# Patient Record
Sex: Female | Born: 1960
Health system: Southern US, Community
[De-identification: ages and names within clinical notes are randomized; demographics above are authoritative.]

## PROBLEM LIST (undated history)

## (undated) DIAGNOSIS — G4700 Insomnia, unspecified: Secondary | ICD-10-CM

## (undated) DIAGNOSIS — Z8709 Personal history of other diseases of the respiratory system: Secondary | ICD-10-CM

## (undated) DIAGNOSIS — R112 Nausea with vomiting, unspecified: Secondary | ICD-10-CM

## (undated) DIAGNOSIS — F329 Major depressive disorder, single episode, unspecified: Secondary | ICD-10-CM

## (undated) DIAGNOSIS — R519 Headache, unspecified: Secondary | ICD-10-CM

## (undated) DIAGNOSIS — M1990 Unspecified osteoarthritis, unspecified site: Secondary | ICD-10-CM

## (undated) DIAGNOSIS — R42 Dizziness and giddiness: Secondary | ICD-10-CM

## (undated) DIAGNOSIS — R351 Nocturia: Secondary | ICD-10-CM

## (undated) DIAGNOSIS — Z9889 Other specified postprocedural states: Secondary | ICD-10-CM

## (undated) DIAGNOSIS — G8929 Other chronic pain: Secondary | ICD-10-CM

## (undated) DIAGNOSIS — I10 Essential (primary) hypertension: Secondary | ICD-10-CM

## (undated) DIAGNOSIS — R531 Weakness: Secondary | ICD-10-CM

## (undated) DIAGNOSIS — R51 Headache: Secondary | ICD-10-CM

## (undated) DIAGNOSIS — Z860100 Personal history of colon polyps, unspecified: Secondary | ICD-10-CM

## (undated) DIAGNOSIS — G4733 Obstructive sleep apnea (adult) (pediatric): Secondary | ICD-10-CM

## (undated) DIAGNOSIS — I359 Nonrheumatic aortic valve disorder, unspecified: Secondary | ICD-10-CM

## (undated) DIAGNOSIS — F32A Depression, unspecified: Secondary | ICD-10-CM

## (undated) DIAGNOSIS — M549 Dorsalgia, unspecified: Secondary | ICD-10-CM

## (undated) DIAGNOSIS — E785 Hyperlipidemia, unspecified: Secondary | ICD-10-CM

## (undated) DIAGNOSIS — E119 Type 2 diabetes mellitus without complications: Secondary | ICD-10-CM

## (undated) DIAGNOSIS — R3915 Urgency of urination: Secondary | ICD-10-CM

## (undated) DIAGNOSIS — R011 Cardiac murmur, unspecified: Secondary | ICD-10-CM

## (undated) DIAGNOSIS — R350 Frequency of micturition: Secondary | ICD-10-CM

## (undated) DIAGNOSIS — Z9989 Dependence on other enabling machines and devices: Secondary | ICD-10-CM

## (undated) HISTORY — PX: CHOLECYSTECTOMY: SHX55

## (undated) HISTORY — PX: COLONOSCOPY: SHX174

## (undated) HISTORY — DX: Obstructive sleep apnea (adult) (pediatric): G47.33

## (undated) HISTORY — PX: OTHER SURGICAL HISTORY: SHX169

## (undated) HISTORY — DX: Depression, unspecified: F32.A

## (undated) HISTORY — DX: Hyperlipidemia, unspecified: E78.5

## (undated) HISTORY — DX: Dependence on other enabling machines and devices: Z99.89

## (undated) HISTORY — PX: BREAST CYST ASPIRATION: SHX578

## (undated) HISTORY — DX: Major depressive disorder, single episode, unspecified: F32.9

## (undated) HISTORY — DX: Type 2 diabetes mellitus without complications: E11.9

## (undated) HISTORY — DX: Nonrheumatic aortic valve disorder, unspecified: I35.9

## (undated) HISTORY — PX: BACK SURGERY: SHX140

## (undated) HISTORY — PX: CERVICAL FUSION: SHX112

---

## 1996-07-13 HISTORY — PX: ABDOMINAL HYSTERECTOMY: SHX81

## 2002-06-23 ENCOUNTER — Encounter: Payer: Self-pay | Admitting: Family Medicine

## 2002-06-23 ENCOUNTER — Encounter: Admission: RE | Admit: 2002-06-23 | Discharge: 2002-06-23 | Payer: Self-pay | Admitting: Family Medicine

## 2002-07-07 ENCOUNTER — Encounter: Admission: RE | Admit: 2002-07-07 | Discharge: 2002-07-07 | Payer: Self-pay | Admitting: Family Medicine

## 2002-07-07 ENCOUNTER — Encounter: Payer: Self-pay | Admitting: Family Medicine

## 2002-07-21 ENCOUNTER — Ambulatory Visit (HOSPITAL_COMMUNITY): Admission: RE | Admit: 2002-07-21 | Discharge: 2002-07-21 | Payer: Self-pay | Admitting: General Surgery

## 2002-07-21 ENCOUNTER — Encounter (INDEPENDENT_AMBULATORY_CARE_PROVIDER_SITE_OTHER): Payer: Self-pay | Admitting: Specialist

## 2002-10-18 ENCOUNTER — Encounter (INDEPENDENT_AMBULATORY_CARE_PROVIDER_SITE_OTHER): Payer: Self-pay

## 2002-10-18 ENCOUNTER — Ambulatory Visit (HOSPITAL_COMMUNITY): Admission: RE | Admit: 2002-10-18 | Discharge: 2002-10-18 | Payer: Self-pay | Admitting: *Deleted

## 2005-02-07 ENCOUNTER — Emergency Department (HOSPITAL_COMMUNITY): Admission: EM | Admit: 2005-02-07 | Discharge: 2005-02-07 | Payer: Self-pay | Admitting: Emergency Medicine

## 2005-03-07 ENCOUNTER — Encounter (INDEPENDENT_AMBULATORY_CARE_PROVIDER_SITE_OTHER): Payer: Self-pay | Admitting: Specialist

## 2005-03-07 ENCOUNTER — Observation Stay (HOSPITAL_COMMUNITY): Admission: EM | Admit: 2005-03-07 | Discharge: 2005-03-08 | Payer: Self-pay | Admitting: Emergency Medicine

## 2005-07-10 ENCOUNTER — Encounter: Admission: RE | Admit: 2005-07-10 | Discharge: 2005-07-10 | Payer: Self-pay | Admitting: Family Medicine

## 2006-07-19 ENCOUNTER — Encounter: Admission: RE | Admit: 2006-07-19 | Discharge: 2006-07-19 | Payer: Self-pay | Admitting: Family Medicine

## 2006-12-29 ENCOUNTER — Inpatient Hospital Stay (HOSPITAL_BASED_OUTPATIENT_CLINIC_OR_DEPARTMENT_OTHER): Admission: RE | Admit: 2006-12-29 | Discharge: 2006-12-29 | Payer: Self-pay | Admitting: Cardiology

## 2007-07-26 ENCOUNTER — Other Ambulatory Visit: Admission: RE | Admit: 2007-07-26 | Discharge: 2007-07-26 | Payer: Self-pay | Admitting: Family Medicine

## 2007-08-04 ENCOUNTER — Encounter: Admission: RE | Admit: 2007-08-04 | Discharge: 2007-08-04 | Payer: Self-pay | Admitting: Family Medicine

## 2007-12-28 ENCOUNTER — Ambulatory Visit (HOSPITAL_BASED_OUTPATIENT_CLINIC_OR_DEPARTMENT_OTHER): Admission: RE | Admit: 2007-12-28 | Discharge: 2007-12-29 | Payer: Self-pay | Admitting: Urology

## 2008-08-31 ENCOUNTER — Encounter: Admission: RE | Admit: 2008-08-31 | Discharge: 2008-08-31 | Payer: Self-pay | Admitting: Family Medicine

## 2008-11-21 ENCOUNTER — Encounter: Admission: RE | Admit: 2008-11-21 | Discharge: 2008-11-21 | Payer: Self-pay | Admitting: Family Medicine

## 2009-11-15 ENCOUNTER — Encounter: Admission: RE | Admit: 2009-11-15 | Discharge: 2009-11-15 | Payer: Self-pay | Admitting: Family Medicine

## 2010-08-03 ENCOUNTER — Encounter: Payer: Self-pay | Admitting: Family Medicine

## 2010-10-31 ENCOUNTER — Other Ambulatory Visit: Payer: Self-pay | Admitting: Family Medicine

## 2010-10-31 DIAGNOSIS — Z1231 Encounter for screening mammogram for malignant neoplasm of breast: Secondary | ICD-10-CM

## 2010-11-21 ENCOUNTER — Ambulatory Visit
Admission: RE | Admit: 2010-11-21 | Discharge: 2010-11-21 | Disposition: A | Discharge status: Home / Self Care | Payer: BC Managed Care – PPO | Source: Ambulatory Visit | Attending: Family Medicine | Admitting: Family Medicine

## 2010-11-21 DIAGNOSIS — Z1231 Encounter for screening mammogram for malignant neoplasm of breast: Secondary | ICD-10-CM

## 2010-11-25 NOTE — Op Note (Signed)
[name], [name]               ACCOUNT NO.:  [id number]   MEDICAL RECORD NO.:  [id number]          PATIENT TYPE:  AMB   LOCATION:  [facility]                         FACILITY:  [facility]   PHYSICIAN:  Mark C. Vernie Ammons, M.D.  DATE OF BIRTH:  Dec 16, 1960   DATE OF PROCEDURE:  12/28/2007  DATE OF DISCHARGE:                               OPERATIVE REPORT   PREOPERATIVE DIAGNOSIS:  Cystocele.   POSTOPERATIVE DIAGNOSIS:  Cystocele.   PROCEDURE:  Anterior repair.   SURGEON:  Mark C. Vernie Ammons, M.D.   ASSISTANT:  Dr. Vonita Moss.   ANESTHESIA:  General.   DRAINS:  A 16-French Foley catheter.   BLOOD LOSS:  Approximately 25 mL.   SPECIMENS:  None.   COMPLICATIONS:  None.   INDICATIONS:  The patient is a 50 year old female with stress urinary  incontinence.  She was found on physical examination to have a grade 1-2  cystocele, but no rectocele.  She has elect to proceed with surgical  correction of her stress incontinence and also repair of her cystocele  simultaneously.  The risks, complications and alternatives have been  discussed with her.  She understands and elected to proceed.   DESCRIPTION OF OPERATION:  After informed consent, the patient was  brought to the major OR, placed on the table, administered general  anesthesia and then moved into the dorsal lithotomy position.  Her  genitalia was sterilely prepped and draped and an official time-out was  then performed.   A 16-French Foley catheter was placed in the bladder and a weighted  speculum was placed in the vagina.  Marcaine with epinephrine was then  used to infiltrate the subvaginal mucosa in the midline.  After allowing  adequate time for epinephrine effect, a midline incision was then made  from just inside the introitus back to the posterior aspect of the  vagina overlying the cystocele.  I used a combination of blunt and sharp  dissection to then dissect the bladder from the overlying vaginal mucosa  on each side,  exposing the cystocele and the lateral pubocervical  ligaments on each side.   A 0 braided polyester suture was then used in a figure eight fashion and  placed interrupted in the pubocervical ligaments on each side  approximating these in the midline and obliterating the cystocele  defect.  After performing this, the excess vaginal mucosa was excised  and the vaginal mucosal edges were begun with closure with running 2-0  Vicryl suture.  I stopped the closure about halfway through the incision  to allow adequate room for Dr. Vonita Moss to  perform his transobturator sling procedure, and then he proceeded to  close the remaining vaginal mucosa.  The patient tolerated this portion  of the procedure well with no intraoperative complications.  She will be  observed overnight and discharged in the morning.      Mark C. Vernie Ammons, M.D.  Electronically Signed     MCO/MEDQ  D:  12/28/2007  T:  12/28/2007  Job:  [id number]

## 2010-11-25 NOTE — H&P (Signed)
[name]                ACCOUNT NO.:  [id number]   MEDICAL RECORD NO.:  [id number]           PATIENT TYPE:   LOCATION:                                 FACILITY:   PHYSICIAN:  [name], M.D.    DATE OF BIRTH:   DATE OF ADMISSION:  12/29/2006  DATE OF DISCHARGE:                              HISTORY & PHYSICAL   CHIEF COMPLAINT:  Chest pain.   HISTORY OF PRESENT ILLNESS:  [name] is a 50 year old obese white  female who presents for elective cardiac catheterization.  She was seen  for evaluation of chest discomfort and shortness of breath over the past  52-month.  Her discomfort is described as a pressure-like sensation.  It  seems to be worse with exertion, but she has also had symptoms at rest.  It can be persistent for long periods of time.  She was referred for  Cardiolite testing which demonstrated poor exercise tolerance.  She  subsequently required adenosine infusion.  Her scintigraphic images  showed an anterior reversible defect that may be breast attenuation, but  ischemia could not be ruled out.  Her ejection fraction was normal at  66%.  She is now referred for diagnostic catheterization.   PAST MEDICAL HISTORY.:  1. Mild aortic insufficiency.  She does have the question of bicuspid      aortic valve.  2. History of tubal ligation in 1990.  3. Hysterectomy with bladder repair in 1998.  4. Gallbladder surgery in 2006.  5. Childbirth x2.  6. Hypertension.   ALLERGIES:  None.   CURRENT MEDICATIONS:  1. Hydrochlorothiazide 25 mg a day.  2. Lisinopril 10 mg a day.  3. Omega-3 daily.  4. Baby aspirin daily.   FAMILY HISTORY:  Father died at 16 but had bypass surgery.  Mother is  living up into her 62s.   SOCIAL HISTORY:  She works as a Merchandiser, retail at [employer], [employer].  She has  not smoked or used alcohol for the last 10-12 years.  She lives at home  with her husband and two daughters.   REVIEW OF SYSTEMS:  Is basically as noted above.  She  does have some  degree of chronic fatigue.  She has had a diagnosis of sleep apnea made  in the past.  She does have some complaints of arthritis in knees and  ankles.   PHYSICAL EXAMINATION:  GENERAL:  She is a pleasant young white female  who is overweight.  VITAL SIGNS:  Her blood pressure is 110/70 sitting and standing. Weight  is 192.  SKIN:  Warm and dry.  Color is unremarkable.  LUNGS:  Clear.  HEART:  Shows a regular rhythm.  She does have a very faint systolic  murmur, and S2 is somewhat increased,  ABDOMEN:  Soft.  EXTREMITIES:  Without edema.  NEUROLOGIC:  Shows no gross focal deficits.   Pertinent labs are pending.   OVERALL IMPRESSION:  1. Atypical chest pain.  2. Abnormal Cardiolite study.  3. Probable bicuspid aortic valve.  4. Hypertension.  5. Obesity.   PLAN:  Will proceed  on with diagnostic catheterization.  The procedure,  risks, and benefits have all been explained, and she is willing to  proceed on Wednesday, December 29, 2006.      [name], N.P.      [name], M.D.  Electronically Signed    LC/MEDQ  D:  12/27/2006  T:  12/27/2006  Job:  [id number]   cc:   [name] L. [name], M.D.

## 2010-11-25 NOTE — Op Note (Signed)
[name], [name]               ACCOUNT NO.:  [id number]   MEDICAL RECORD NO.:  [id number]         PATIENT TYPE:  HAMB   LOCATION:                               FACILITY:  [facility]   PHYSICIAN:  Maretta Bees. Vonita Moss, M.D.DATE OF BIRTH:  Dec 10, 1960   DATE OF PROCEDURE:  12/28/2007  DATE OF DISCHARGE:                               OPERATIVE REPORT   PREOPERATIVE DIAGNOSIS:  Stress urinary incontinence and cystocele.   POSTOPERATIVE DIAGNOSIS:  Stress urinary incontinence and cystocele.   PROCEDURE:  Obturator sling insertion.   SURGEON:  Maretta Bees. Vonita Moss, M.D.   ANESTHESIA:  General.   INDICATIONS:  This 50 year old lady has had a progressive problem with  stress incontinence and some urge incontinence.  She needs to wear pads.  She has had a previous hysterectomy and bladder suspension in 1998.  Urodynamics was performed and showed a good flow rate and a somewhat  hypersensitive bladder.  She leaked with high pressure contraction and a  leak point voiding pressure was at 75 mL with 157 cm of water.  She did  have a cystocele.  She has a question of dyssynergia and Dr. McDiarmid  felt that she still would be a good candidate for anterior repair and  sling although she could have some increased risk of retention or  frequency afterwards.  She was advised about the risk of bladder injury,  lack of a correction of her problem, incontinence, hemorrhage or  infection.   PROCEDURE IN DETAIL:  The patient was brought to the operating room and  initially Dr. Vernie Ammons with my assistance proceeded with a vaginal  incision and anterior repair dictated separately by him.  After he  completed his anterior repair and initially began the vaginal closure I  proceeded to insert an obturator sling.  The endopelvic fascia was well  exposed digitally.  Puncture wounds were made at the level of the  clitoris in the obturator fossa bilaterally.  A stab wound was made and  hemostat used to create a  satisfactory opening for the sling.  Using the  curved sling needle I marched it on the back of the obturator bone on  each side entering through the stab wounds and exiting in the endopelvic  fascia mid urethra on each side.  The sling was attached and pulled up  into position but before it was put in a final position I cystoscoped  and there was no evidence of bladder or urethral injury.  Foley catheter  was reinserted.  The sling was snugged down to allow a hemostat between  the urethra and the sling.  Excess sling was trimmed at the puncture  wounds.  Wound was irrigated with antibiotic solution.  The sling was  felt to be with no undue tension and in the mid urethra.  The vaginal  incision was then completely closed with a running 2-0 Vicryl.  The  obturator puncture wounds were closed with Dermabond.  Estrace cream  impregnated vaginal pack was placed  vaginally and Foley catheter was left indwelling with clear urine.  She  was taken to the recovery  room in good condition with estimated blood  loss of 50 mL and having tolerated the procedure well.  Sponge, needle  and instrument count was correct.      Maretta Bees. Vonita Moss, M.D.  Electronically Signed     LJP/MEDQ  D:  12/28/2007  T:  12/28/2007  Job:  [id number]   cc:   Molly Maduro L. Foy Guadalajara, M.D.  Fax: [phone]

## 2010-11-25 NOTE — Cardiovascular Report (Signed)
[name], [name]               ACCOUNT NO.:  [id number]   MEDICAL RECORD NO.:  [id number]          PATIENT TYPE:  OIB   LOCATION:  1962                         FACILITY:  [facility]   PHYSICIAN:  Colleen Can. Deborah Chalk, M.D.DATE OF BIRTH:  15-May-1961   DATE OF PROCEDURE:  12/29/2006  DATE OF DISCHARGE:  12/29/2006                            CARDIAC CATHETERIZATION   PROCEDURE:  Left heart catheterization with selective coronary  angiography and left ventricular angiography.   TYPE AND SITE OF ENTRY:  Percutaneous right femoral artery.   CATHETERS:  4-French 4-curved Judkins left coronary catheter, 4-French  Williams right catheter, 4-French pigtail ventriculographic catheter.   CONTRAST:  Pure Omnipaque.   MEDICATIONS GIVEN PRIOR TO PROCEDURE:  Valium 10 mg.   MEDICATIONS GIVEN DURING PROCEDURE:  Versed 4 mg IV.   COMMENT:  The patient tolerated the procedure well.   HEMODYNAMIC DATA:  1. Aortic Pressure:  95/56,  2. Left Ventricular:  113/2-14.  On aortic valve pullback the LV pressure was 112/5-16, and aortic  pressure was 106/56 -- given what was felt to be a possible aortic valve  gradient of 6 mmHg.   ANGIOGRAPHIC DATA:  The left ventricular angiogram was performed in the  RAO position.  Overall cardiac size and silhouette are normal.  The  global ejection fraction estimated to be 60-70%.   CORONARY ARTERIES:  The coronary arteries arise and distribute normally.  1. The left main coronary artery is normal.  2. The left anterior descending has a moderate-sized high diagonal      vessel.  It is normal.  3. The left circumflex has a moderate sized bifurcating proximal      obtuse marginal, a large posterolateral branch.  It is normal.  4. The right coronary artery has a moderate sized dominant vessel.  It      is normal.   OVERALL IMPRESSION:  1. Normal left ventricular function.  2. Normal coronary arteries.  3. Minimal aortic valve gradient of less than 8 mmHg and  perhaps no      gradient existing, due to variations at the time of pullback.   DISCUSSION:  Overall, it is felt that [name] is stable from this  point in time.  She may have a bicuspid aortic valve, but it is felt not  to be hemodynamically significant.  She has normal coronary arteries and  normal left ventricular function.  We will make all efforts to modify  cardiovascular risk factors.  It is felt that the abnormal Cardiolite  study is related to breast attenuation.      Colleen Can. Deborah Chalk, M.D.  Electronically Signed     SNT/MEDQ  D:  12/29/2006  T:  12/29/2006  Job:  [id number]   cc:   Rae Lips. Foy Guadalajara, M.D.

## 2010-11-28 NOTE — Consult Note (Signed)
[name], [name] NO.:  [id number]   MEDICAL RECORD NO.:  [id number]          PATIENT TYPE:  EMS   LOCATION:  [facility]                         FACILITY:  [facility]   PHYSICIAN:  Sandria Bales. Ezzard Standing, M.D.  DATE OF BIRTH:  09/08/1960   DATE OF CONSULTATION:  DATE OF DISCHARGE:                                   CONSULTATION   CONSULTING PHYSICIAN:  Sandria Bales. Ezzard Standing, M.D.   HISTORY OF ILLNESS:  This is a 50 year old white female who is a patient of  Dr. Marinda Elk, who has had no prior GI symptoms, had sudden onset of  retrosternal pain about 1 a.m. this morning.  She went to the [facility].  They were concerned about cardiac disease and sent her to [facility] ER by ambulance.  [facility] ER evaluation was negative from a cardiac  standpoint.   It was  thought by the time she got here she was having more right upper  quadrant pain which radiated to her right back.  Her CK-MB was negative.  Her troponin was negative.  She went for an ultrasound of her abdomen that  showed a gallbladder with multiple stones, probable impacted stone.  She has  denied any history of peptic ulcer disease, liver disease, pancreatic  disease, or colon disease.  Her only prior surgery was a hysterectomy for  benign disease in 1996 by Dr. Katherine Roan.   PAST MEDICAL HISTORY:  1.  She has no allergies.  2.  Her only medication was hydrocodone for her back.   REVIEW OF SYSTEMS:  NEUROLOGIC:  She has apparently had an L5 disk with a  neuropathy down her right leg.  She has been seen by Dr. Tia Alert and  underwent a steroid injection about two weeks ago for this and is still out  because of her back pain.  PULMONARY:  No history of pneumonia, tuberculosis.  She does not smoke  cigarettes.  CARDIAC:  She has a mitral valve prolapse, has a heart murmur, has seen Dr.  Deborah Chalk about every five years.  GASTROINTESTINAL:  See history of present illness.  UROLOGIC:  She has had occasional  urinary tract infection or kidney  infection but never hospitalized for this.  GYN:  She has had a hysterectomy.   SOCIAL HISTORY:  She has two girls 16 and 20-years-of-age.  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  She is accompanied by her husband in the emergency room.   PHYSICAL EXAMINATION:  VITAL SIGNS:  Her temperature is 98.1, blood pressure  167/73, pulse 69, respirations 18, 02 sats are 100%.  GENERAL:  She is a well nourished, obese, white female, alert and  cooperative on physical exam.  HEENT:  Unremarkable.  NECK:  Supple.  I feel no masses, no thyromegaly.  LUNGS:  Clear to auscultation.  HEART:  Regular rate and rhythm.  I could not hear a murmur, though she says  she has one.  BREASTS:  Symmetric.  She has undergone mammograms which were reported as  normal.  ABDOMEN:  She is mildly sore in the epigastrium, maybe less so on the right  side of her belly.  She has bowel sounds which are present but may be  decreased.  She has no abdominal mass, no hernia, no CVA tenderness.  RECTAL:  I did not do a rectal exam on her.  EXTREMITIES:  She has good strength in the upper and lower extremities.  NEUROLOGIC:  Grossly intact.   LABS:  That I have, again, show a myoglobin is 23, a CK-MB is less than 1, a  troponin less than 0.05.  Her sodium is 133, potassium 4.2, chloride 101,  CO2 is 21.  Her creatinine is 0.7.  Her alk phos is 63, total bilirubin 0.6.  Her lipase is 12.  Her white blood count is 16,200, hemoglobin 13,  hematocrit 39.  I reviewed the ultrasound which showed multiple stones.  It is hard to tell  if one stone is impacted or not.  There is no thickening of the gallbladder  wall.   DIAGNOSES:  1.  Gallstones with probable biliary colic, possible cholecystitis.      Discussed with the patient about proceeding with gallbladder surgery      today or tomorrow.  I discussed with the patient and her husband the       indications and potential complications of gallbladder surgery.  I told      her there is a chance of the surgery being done open.  The potential      risks include, but are not limited to, infection and bleeding, bowel      injury, and common bile duct injury.  I think they understand this and      are willing to proceed with surgery.  2.  L5 disk disease, followed by Dr. Marikay Alar.  3.  Mitral valve prolapse, followed by Dr. Delfin Edis.  4.  Obesity.  5.  History of prior right breast cyst which was benign.      Sandria Bales. Ezzard Standing, M.D.  Electronically Signed     DHN/MEDQ  D:  03/07/2005  T:  03/07/2005  Job:  [id number]   cc:   Molly Maduro L. Foy Guadalajara, M.D.  [address] [place]  [place] [address]  Fax: [phone]   Jillyn Hidden. Deborah Chalk, M.D.  Fax: [phone]

## 2010-11-28 NOTE — Op Note (Signed)
   NAME:  [name], [name]                         ACCOUNT NO.:  [id number]   MEDICAL RECORD NO.:  [id number]                   PATIENT TYPE:  AMB   LOCATION:  DAY                                  [facility]ACILITY[facility]  [facility]   PHYSICIAN:  Ollen Gross. Vernell Morgans, M.D.              DATE OF BIRTH:  11/02/1960   DATE OF PROCEDURE:  07/21/2002  DATE OF DISCHARGE:                                 OPERATIVE REPORT   PREOPERATIVE DIAGNOSIS:  Intraductal papilloma beneath the right nipple.   POSTOPERATIVE DIAGNOSIS:  Intraductal papilloma beneath the right nipple.   PROCEDURE:  Subareolar right breast biopsy.   SURGEON:  Ollen Gross. Carolynne Edouard, M.D.   ANESTHESIA:  General via LMA.   DESCRIPTION OF PROCEDURE:  After informed consent was obtained, the patient  was brought to the operating room and placed in a supine position on the  operating room table.  After adequate induction of general anesthesia, the  patient's right breast was prepped with Betadine and draped in the usual  sterile manner.  A small transverse incision was made just beneath the  nipple on the right breast.  This incision was carried down through the skin  and subcutaneous tissue using sharp dissection with the electrocautery.  Once this was done, the tissue beneath the right nipple was excised sharply  with the Bovie electrocautery.  Hemostasis was achieved with the  electrocautery, and a small button hole type  of hole was made in the skin  just superior to the nipple.  The edges of this were cut out sharply with  the Metzenbaum scissors, and this hole was repaired with an interrupted 4-0  Monocryl subcuticular stitch.  The specimen was then removed from the  patient, sent to pathology for further evaluation.  The skin incision was  then closed with interrupted 4-0 Monocryl subcuticular stitches.  Benzoin,  Steri-Strips, and sterile dressings were applied.  The patient tolerated the  procedure well.  At the end of the case, all needle and sponge  counts were  correct.  The patient was awakened and taken to the recovery room in stable  condition.                                               Ollen Gross. Vernell Morgans, M.D.    PST/MEDQ  D:  07/21/2002  T:  07/21/2002  Job:  [id number]

## 2010-11-28 NOTE — Op Note (Signed)
[name], [name]               ACCOUNT NO.:  [id number]   MEDICAL RECORD NO.:  [id number]          PATIENT TYPE:  INP   LOCATION:  5732                         FACILITY:  [facility]   PHYSICIAN:  Sandria Bales. Ezzard Standing, M.D.  DATE OF BIRTH:  [birth year]   DATE OF PROCEDURE:  03/07/2005  DATE OF DISCHARGE:  03/08/2005                                 OPERATIVE REPORT   PREOPERATIVE DIAGNOSIS:  Cholecystitis with cholelithiasis.   POSTOPERATIVE DIAGNOSIS:  Acute and chronic cholecystitis with  cholelithiasis.   PROCEDURE:  Laparoscopic cholecystectomy with intraoperative cholangiogram.   SURGEON:  Sandria Bales. Ezzard Standing, M.D.   FIRST ASSISTANT:  Angelia Mould. Derrell Lolling, M.D.   ANESTHESIA:  General endotracheal with about 20 mL of 1% Xylocaine.   COMPLICATIONS:  None.   INDICATION FOR PROCEDURE:  [name] is a 50 year old white female, a  patient of Dr. Dewaine Oats, who has had a sudden onset of retrosternal and  right upper quadrant pain at approximately 1 a.m. this morning.  She  originally went to the [facility]; they were worried about cardiac  disease and sent her to the [facility] ER, who ruled out cardiac disease, but by  ultrasound, found she had multiple gallstones with what appeared to be an  impacted gallstone in the neck of her gallbladder.   She now comes for attempted laparoscopic cholecystectomy.  The indications  and potential complications of this surgery were explained to the patient;  potential complications include, but are not limited to, bleeding,  infection, bowel injury, bile duct injury and open surgery.   OPERATIVE NOTE:  The patient underwent a general endotracheal anesthetic.  She was given 1 g of Ancef at initiation of the procedure and had PAS  stockings in place.  Her abdomen was prepped with Betadine soaping solution  and sterilely draped.   An infraumbilical incision was made with sharp dissection and carried down  into the abdominal cavity.  I placed a 10-mm  0-degree laparoscope through a  12-mm Hasson trocar and secured this with a 0 Vicryl suture.  About midway  through the procedure, I did have to switch to a 30-degree angle scope just  because of how much fat she had in her abdomen and how much limited bowel  space.  I then placed 3 additional trocars, a 10-mm subxiphoid trocar and  then a 5-mm right subcostal and a 5-mm lateral subcostal.  To get the  lateral subcostal trocars placed, I did have to take the right colon which  was attached to the anterior abdominal wall, it was just adhesed up there,  actually, where I pulled these adhesions down.   I then identified the gallbladder, which was acutely distended and had  evidence of chronic disease.   I did decompress about 30 mL of a green muddy bilious material out of the  gallbladder and rotated the gallbladder up.   Dissection was carried out down to the cystic duct-gallbladder junction and  a clip was placed on the side of the gallbladder.   I then shot an intraoperative cholangiogram; the intraoperative  cholangiogram was shot  with a cut-off Taut catheter inserted through a 14-  gauge Jelco catheter and the Jelco catheter secured in the side of the cut  cystic duct with an endoclip.   The intraoperative cholangiogram was shot using half-strength Hypaque  solution, about 6 mL, which showed free flow of contrast down the cystic  duct into the common bile duct, into the duodenum and back up the hepatic  radicles.  Before putting the Taut catheter into the cystic duct, I did milk  back 2 stones, each about 4-5 mm in size, which were clearly impacted in the  cystic duct.  After the cholangiogram, which was felt to be normal, I then  placed 3 clips across the cystic duct, divided the cystic duct, identified  the cystic artery, put 2 clips on that and divided that.  She had at least 2  branches posteriorly going up her gallbladder wall that would also require  clips to control  bleeding.   I then dissected the gallbladder from the gallbladder bed using primary hook  Bovie coagulation.  Prior to complete division of the gallbladder from the  gallbladder bed, I reinspected the triangle of Calot; there was no bleeding  or bile leak.  I reinspected the gallbladder bed; there was no bleeding or  bile leak.   The gallbladder was then divided, placed into an EndoCatch bag and delivered  through the umbilicus.  I did spill maybe 5 or 6 small cholesterol stones,  each 3 or 4 mm, but these were retrieved with the scooper instrument.  I  then irrigated the abdomen and saw no residual stones, no bleeding, no bile  leak and this would be the end of the case.   The trocar placements were visualized; there was no bleeding at the trocar  sites.  The umbilical port was closed with a 0 Vicryl suture.  The skin at  each site was closed with 5-0 Vicryl suture, painted with tincture of  Benzoin and steri-stripped.  I used about 20 mL of local anesthetic in the  trocar sites.   The patient tolerated the procedure well.  Sponge and needle count were  correct at the end of the case.      Sandria Bales. Ezzard Standing, M.D.  Electronically Signed     DHN/MEDQ  D:  03/07/2005  T:  03/09/2005  Job:  [id number]   cc:   [name], M.D.  [address] [place]  [place] [address]  Fax: [phone]   [name], M.D.  [address] [place]  [place] [address]  Fax: [phone]   [name], M.D.  Fax: [phone]

## 2011-04-09 LAB — POCT I-STAT 4, (NA,K, GLUC, HGB,HCT)
Glucose, Bld: 111 — ABNORMAL HIGH
Sodium: 138

## 2011-09-01 ENCOUNTER — Other Ambulatory Visit: Payer: Self-pay | Admitting: Family Medicine

## 2011-09-01 DIAGNOSIS — M25562 Pain in left knee: Secondary | ICD-10-CM

## 2011-09-04 ENCOUNTER — Ambulatory Visit
Admission: RE | Admit: 2011-09-04 | Discharge: 2011-09-04 | Disposition: A | Discharge status: Home / Self Care | Payer: BC Managed Care – PPO | Source: Ambulatory Visit | Attending: Family Medicine | Admitting: Family Medicine

## 2011-09-04 DIAGNOSIS — M25562 Pain in left knee: Secondary | ICD-10-CM

## 2011-10-23 ENCOUNTER — Other Ambulatory Visit: Payer: Self-pay | Admitting: Family Medicine

## 2011-10-23 DIAGNOSIS — Z1231 Encounter for screening mammogram for malignant neoplasm of breast: Secondary | ICD-10-CM

## 2011-11-27 ENCOUNTER — Ambulatory Visit
Admission: RE | Admit: 2011-11-27 | Discharge: 2011-11-27 | Disposition: A | Discharge status: Home / Self Care | Payer: BC Managed Care – PPO | Source: Ambulatory Visit | Attending: Family Medicine | Admitting: Family Medicine

## 2011-11-27 DIAGNOSIS — Z1231 Encounter for screening mammogram for malignant neoplasm of breast: Secondary | ICD-10-CM

## 2012-12-16 ENCOUNTER — Other Ambulatory Visit: Payer: Self-pay | Admitting: Family Medicine

## 2012-12-16 DIAGNOSIS — Z1231 Encounter for screening mammogram for malignant neoplasm of breast: Secondary | ICD-10-CM

## 2013-01-20 ENCOUNTER — Ambulatory Visit: Payer: BC Managed Care – PPO

## 2013-01-27 ENCOUNTER — Ambulatory Visit: Payer: BC Managed Care – PPO

## 2013-04-14 ENCOUNTER — Ambulatory Visit
Admission: RE | Admit: 2013-04-14 | Discharge: 2013-04-14 | Disposition: A | Discharge status: Home / Self Care | Payer: BC Managed Care – PPO | Source: Ambulatory Visit | Attending: Family Medicine | Admitting: Family Medicine

## 2013-04-14 DIAGNOSIS — Z1231 Encounter for screening mammogram for malignant neoplasm of breast: Secondary | ICD-10-CM

## 2013-04-28 ENCOUNTER — Encounter (HOSPITAL_BASED_OUTPATIENT_CLINIC_OR_DEPARTMENT_OTHER): Payer: Self-pay | Admitting: Emergency Medicine

## 2013-04-28 ENCOUNTER — Emergency Department (HOSPITAL_BASED_OUTPATIENT_CLINIC_OR_DEPARTMENT_OTHER)
Admission: EM | Admit: 2013-04-28 | Discharge: 2013-04-28 | Disposition: A | Discharge status: Home / Self Care | Payer: BC Managed Care – PPO | Attending: Emergency Medicine | Admitting: Emergency Medicine

## 2013-04-28 ENCOUNTER — Emergency Department (HOSPITAL_BASED_OUTPATIENT_CLINIC_OR_DEPARTMENT_OTHER): Payer: BC Managed Care – PPO

## 2013-04-28 DIAGNOSIS — M5442 Lumbago with sciatica, left side: Secondary | ICD-10-CM

## 2013-04-28 DIAGNOSIS — M545 Low back pain, unspecified: Secondary | ICD-10-CM | POA: Insufficient documentation

## 2013-04-28 DIAGNOSIS — R209 Unspecified disturbances of skin sensation: Secondary | ICD-10-CM | POA: Insufficient documentation

## 2013-04-28 DIAGNOSIS — R52 Pain, unspecified: Secondary | ICD-10-CM | POA: Insufficient documentation

## 2013-04-28 DIAGNOSIS — Z87440 Personal history of urinary (tract) infections: Secondary | ICD-10-CM | POA: Insufficient documentation

## 2013-04-28 DIAGNOSIS — M25569 Pain in unspecified knee: Secondary | ICD-10-CM | POA: Insufficient documentation

## 2013-04-28 DIAGNOSIS — Z7982 Long term (current) use of aspirin: Secondary | ICD-10-CM | POA: Insufficient documentation

## 2013-04-28 DIAGNOSIS — Z79899 Other long term (current) drug therapy: Secondary | ICD-10-CM | POA: Insufficient documentation

## 2013-04-28 DIAGNOSIS — I10 Essential (primary) hypertension: Secondary | ICD-10-CM | POA: Insufficient documentation

## 2013-04-28 DIAGNOSIS — Z791 Long term (current) use of non-steroidal anti-inflammatories (NSAID): Secondary | ICD-10-CM | POA: Insufficient documentation

## 2013-04-28 DIAGNOSIS — R011 Cardiac murmur, unspecified: Secondary | ICD-10-CM | POA: Insufficient documentation

## 2013-04-28 DIAGNOSIS — M5430 Sciatica, unspecified side: Secondary | ICD-10-CM | POA: Insufficient documentation

## 2013-04-28 HISTORY — DX: Essential (primary) hypertension: I10

## 2013-04-28 HISTORY — DX: Cardiac murmur, unspecified: R01.1

## 2013-04-28 MED ORDER — DEXAMETHASONE SODIUM PHOSPHATE 10 MG/ML IJ SOLN
10.0000 mg | Freq: Once | INTRAMUSCULAR | Status: AC
  Administered 2013-04-28: 10 mg via INTRAMUSCULAR
  Filled 2013-04-28: qty 1

## 2013-04-28 NOTE — Discharge Instructions (Signed)
Radicular Pain Radicular pain in either the arm or leg is usually from a bulging or herniated disk in the spine. A piece of the herniated disk may press against the nerves as the nerves exit the spine. This causes pain which is felt at the tips of the nerves down the arm or leg. Other causes of radicular pain may include:  Fractures.  Heart disease.  Cancer.  An abnormal and usually degenerative state of the nervous system or nerves (neuropathy). Diagnosis may require CT or MRI scanning to determine the primary cause.  Nerves that start at the neck (nerve roots) may cause radicular pain in the outer shoulder and arm. It can spread down to the thumb and fingers. The symptoms vary depending on which nerve root has been affected. In most cases radicular pain improves with conservative treatment. Neck problems may require physical therapy, a neck collar, or cervical traction. Treatment may take many weeks, and surgery may be considered if the symptoms do not improve.  Conservative treatment is also recommended for sciatica. Sciatica causes pain to radiate from the lower back or buttock area down the leg into the foot. Often there is a history of back problems. Most patients with sciatica are better after 2 to 4 weeks of rest and other supportive care. Short term bed rest can reduce the disk pressure considerably. Sitting, however, is not a good position since this increases the pressure on the disk. You should avoid bending, lifting, and all other activities which make the problem worse. Traction can be used in severe cases. Surgery is usually reserved for patients who do not improve within the first months of treatment. Only take over-the-counter or prescription medicines for pain, discomfort, or fever as directed by your caregiver. Narcotics and muscle relaxants may help by relieving more severe pain and spasm and by providing mild sedation. Cold or massage can give significant relief. Spinal manipulation  is not recommended. It can increase the degree of disc protrusion. Epidural steroid injections are often effective treatment for radicular pain. These injections deliver medicine to the spinal nerve in the space between the protective covering of the spinal cord and back bones (vertebrae). Your caregiver can give you more information about steroid injections. These injections are most effective when given within two weeks of the onset of pain.  You should see your caregiver for follow up care as recommended. A program for neck and back injury rehabilitation with stretching and strengthening exercises is an important part of management.  SEEK IMMEDIATE MEDICAL CARE IF:  You develop increased pain, weakness, or numbness in your arm or leg.  You develop difficulty with bladder or bowel control.  You develop abdominal pain. Document Released: 08/06/2004 Document Revised: 09/21/2011 Document Reviewed: 10/22/2008 ExitCare Patient Information 2014 [employer].  

## 2013-04-28 NOTE — ED Provider Notes (Signed)
CSN: [id number]     Arrival date & time 04/28/13  1751 History  This chart was scribed for Rolan Bucco, MD by Danella Maiers, ED Scribe. This patient was seen in room MH07/MH07 and the patient's care was started at 6:11 PM.   Chief Complaint  Patient presents with  . Knee Pain    left    Patient is a 52 y.o. female presenting with back pain. The history is provided by the patient. No language interpreter was used.  Back Pain Location:  Lumbar spine Radiates to:  Does not radiate Onset quality:  Gradual Duration:  2 weeks Associated symptoms: numbness   Associated symptoms: no abdominal pain, no chest pain, no dysuria, no fever, no headaches and no weakness    HPI Comments: [name] is a 52 y.o. female who presents to the Emergency Department complaining of constant, worsening, non-radiating pain across the lower back and worsening numbness down the entire left leg onset two weeks ago. She states the numbness started only in her knee and first spread up to her left side, then spread down to her left foot.  She has full ROM of left leg and is able to walk. She denies any pain to the leg. She states she has a Baker's cyst to the left knee which she has some ongoing pain with, but no other complaints. She denies dyspnea, urinary problems. She has an appt with her orthopedist Dr. Penni Bombard with [facility] on 10/31. She denies bowel/bladder incontinence.  Past Medical History  Diagnosis Date  . Hypertension   . Murmur, cardiac   . Urinary tract infection   . Baker's cyst of knee    Past Surgical History  Procedure Laterality Date  . Abdominal hysterectomy    . Bladder tack    . Cholecystectomy     No family history on file. History  Substance Use Topics  . Smoking status: Never Smoker   . Smokeless tobacco: Never Used  . Alcohol Use: No   OB History   Grav Para Term Preterm Abortions TAB SAB Ect Mult Living                 Review of Systems  Constitutional:  Negative for fever, chills, diaphoresis and fatigue.  HENT: Negative for congestion, rhinorrhea and sneezing.   Eyes: Negative.   Respiratory: Negative for cough, chest tightness and shortness of breath.   Cardiovascular: Negative for chest pain and leg swelling.  Gastrointestinal: Negative for nausea, vomiting, abdominal pain, diarrhea and blood in stool.  Genitourinary: Negative for dysuria, urgency, frequency, flank pain, decreased urine volume and difficulty urinating.  Musculoskeletal: Positive for back pain. Negative for arthralgias.  Skin: Negative for rash.  Neurological: Positive for numbness. Negative for dizziness, speech difficulty, weakness and headaches.  All other systems reviewed and are negative.    Allergies  Review of patient's allergies indicates not on file.  Home Medications   Current Outpatient Rx  Name  Route  Sig  Dispense  Refill  . aspirin 81 MG tablet   Oral   Take 81 mg by mouth daily.         . cholecalciferol (VITAMIN D) 1000 UNITS tablet   Oral   Take 1,000 Units by mouth daily.         Marland Kitchen etodolac (LODINE) 400 MG tablet   Oral   Take 400 mg by mouth 2 (two) times daily.         . fish oil-omega-3 fatty acids  1000 MG capsule   Oral   Take 2 g by mouth daily.         . hydrochlorothiazide (HYDRODIURIL) 25 MG tablet   Oral   Take 25 mg by mouth daily.         Marland Kitchen lisinopril (PRINIVIL,ZESTRIL) 10 MG tablet   Oral   Take 10 mg by mouth daily.          BP 148/82  Pulse 84  Temp(Src) 98.7 F (37.1 C) (Oral)  Resp 16  Ht 5\' 2"  (1.575 m)  Wt 220 lb (99.791 kg)  BMI 40.23 kg/m2  SpO2 97% Physical Exam  Constitutional: She is oriented to person, place, and time. She appears well-developed and well-nourished.  HENT:  Head: Normocephalic and atraumatic.  Eyes: Pupils are equal, round, and reactive to light.  Neck: Normal range of motion. Neck supple.  Cardiovascular: Normal rate, regular rhythm and normal heart sounds.    Pulmonary/Chest: Effort normal and breath sounds normal. No respiratory distress. She has no wheezes. She has no rales. She exhibits no tenderness.  Abdominal: Soft. Bowel sounds are normal. There is no tenderness. There is no rebound and no guarding.  Musculoskeletal: Normal range of motion. She exhibits no edema.  Mild TTP lower lumbar spine.  No step off/deformity  Lymphadenopathy:    She has no cervical adenopathy.  Neurological: She is alert and oriented to person, place, and time.  Subjective decrease to LT on palpation of left lower leg, including foot/lower leg/thigh.  Pedal pulses intact.  Normal motor function in legs.  Slight limp with ambulation but able to toe/heel walk.  Negative SLR bilaterally  Skin: Skin is warm and dry. No rash noted.  Psychiatric: She has a normal mood and affect.    ED Course  Procedures (including critical care time) Medications - No data to display  DIAGNOSTIC STUDIES: Oxygen Saturation is 97% on RA, normal by my interpretation.    COORDINATION OF CARE: 6:25 PM- Discussed treatment plan with pt which includes l-spine x-ray. Pt agrees to plan.    Labs Review Labs Reviewed - No data to display Imaging Review Dg Lumbar Spine Complete  04/28/2013   CLINICAL DATA:  Knee pain left leg numbness.  EXAM: LUMBAR SPINE - COMPLETE 4+ VIEW  COMPARISON:  None.  FINDINGS: Early degenerative spurring at L5-S1 at L2-3. Degenerative changes in the lower facettes. No fracture or subluxation. SI joints are symmetric and unremarkable. Prior cholecystectomy.  IMPRESSION: No acute findings. Early degenerative changes.   Electronically Signed   By: Charlett Nose M.D.   On: 04/28/2013 19:07    EKG Interpretation   None       MDM   1. Acute back pain on left side with sciatica    Pt with LBP and numbness to left leg, likely radicular pain from lumbar spine.  Will give decadron, pt has f/u with ortho.  No other neuro deficits or signs of cauda  equina      I personally performed the services described in this documentation, which was scribed in my presence.  The recorded information has been reviewed and considered.    Rolan Bucco, MD 04/28/13 Ernestina [place]

## 2013-04-28 NOTE — ED Notes (Signed)
Patient states she has a Baker's cyst behind her left knee.  States over the last two weeks, she is having numbness in the from her left buttocks down her lateral leg.  States her knee is locking and giving out.  Has an appointment with Dr. Penni Bombard on 05/12/13.

## 2013-05-02 ENCOUNTER — Other Ambulatory Visit: Payer: Self-pay | Admitting: Family Medicine

## 2013-05-02 DIAGNOSIS — M545 Low back pain: Secondary | ICD-10-CM

## 2013-05-05 ENCOUNTER — Ambulatory Visit
Admission: RE | Admit: 2013-05-05 | Discharge: 2013-05-05 | Disposition: A | Discharge status: Home / Self Care | Payer: BC Managed Care – PPO | Source: Ambulatory Visit | Attending: Family Medicine | Admitting: Family Medicine

## 2013-05-05 DIAGNOSIS — M545 Low back pain: Secondary | ICD-10-CM

## 2013-05-08 ENCOUNTER — Other Ambulatory Visit: Payer: Self-pay | Admitting: Family Medicine

## 2013-05-08 DIAGNOSIS — M549 Dorsalgia, unspecified: Secondary | ICD-10-CM

## 2013-05-09 ENCOUNTER — Other Ambulatory Visit: Payer: BC Managed Care – PPO

## 2013-05-16 ENCOUNTER — Other Ambulatory Visit: Payer: Self-pay | Admitting: Family Medicine

## 2013-05-16 ENCOUNTER — Ambulatory Visit
Admission: RE | Admit: 2013-05-16 | Discharge: 2013-05-16 | Disposition: A | Discharge status: Home / Self Care | Payer: BC Managed Care – PPO | Source: Ambulatory Visit | Attending: Family Medicine | Admitting: Family Medicine

## 2013-05-16 DIAGNOSIS — R609 Edema, unspecified: Secondary | ICD-10-CM

## 2014-04-05 ENCOUNTER — Other Ambulatory Visit: Payer: Self-pay | Admitting: Family Medicine

## 2014-04-05 DIAGNOSIS — Z1231 Encounter for screening mammogram for malignant neoplasm of breast: Secondary | ICD-10-CM

## 2014-04-19 ENCOUNTER — Ambulatory Visit
Admission: RE | Admit: 2014-04-19 | Discharge: 2014-04-19 | Disposition: A | Discharge status: Home / Self Care | Payer: BC Managed Care – PPO | Source: Ambulatory Visit | Attending: Family Medicine | Admitting: Family Medicine

## 2014-04-19 DIAGNOSIS — Z1231 Encounter for screening mammogram for malignant neoplasm of breast: Secondary | ICD-10-CM

## 2014-05-22 ENCOUNTER — Encounter: Payer: Self-pay | Admitting: *Deleted

## 2015-01-10 ENCOUNTER — Other Ambulatory Visit (HOSPITAL_COMMUNITY): Payer: Self-pay | Admitting: Neurological Surgery

## 2015-01-10 ENCOUNTER — Other Ambulatory Visit: Payer: Self-pay | Admitting: Neurological Surgery

## 2015-01-10 DIAGNOSIS — M4710 Other spondylosis with myelopathy, site unspecified: Secondary | ICD-10-CM

## 2015-01-31 ENCOUNTER — Ambulatory Visit (HOSPITAL_COMMUNITY)
Admission: RE | Admit: 2015-01-31 | Discharge: 2015-01-31 | Disposition: A | Discharge status: Home / Self Care | Payer: 59 | Source: Ambulatory Visit | Attending: Neurological Surgery | Admitting: Neurological Surgery

## 2015-01-31 ENCOUNTER — Encounter (HOSPITAL_COMMUNITY): Payer: Self-pay

## 2015-01-31 DIAGNOSIS — M549 Dorsalgia, unspecified: Secondary | ICD-10-CM | POA: Diagnosis present

## 2015-01-31 DIAGNOSIS — M5124 Other intervertebral disc displacement, thoracic region: Secondary | ICD-10-CM | POA: Diagnosis not present

## 2015-01-31 DIAGNOSIS — M4710 Other spondylosis with myelopathy, site unspecified: Secondary | ICD-10-CM

## 2015-01-31 DIAGNOSIS — M5137 Other intervertebral disc degeneration, lumbosacral region: Secondary | ICD-10-CM | POA: Insufficient documentation

## 2015-01-31 DIAGNOSIS — M4802 Spinal stenosis, cervical region: Secondary | ICD-10-CM | POA: Diagnosis not present

## 2015-01-31 DIAGNOSIS — M4806 Spinal stenosis, lumbar region: Secondary | ICD-10-CM | POA: Insufficient documentation

## 2015-01-31 DIAGNOSIS — R918 Other nonspecific abnormal finding of lung field: Secondary | ICD-10-CM | POA: Insufficient documentation

## 2015-01-31 DIAGNOSIS — M4807 Spinal stenosis, lumbosacral region: Secondary | ICD-10-CM | POA: Diagnosis not present

## 2015-01-31 MED ORDER — DIAZEPAM 5 MG PO TABS
ORAL_TABLET | ORAL | Status: AC
  Filled 2015-01-31: qty 2

## 2015-01-31 MED ORDER — LIDOCAINE HCL (PF) 1 % IJ SOLN
INTRAMUSCULAR | Status: AC
  Filled 2015-01-31: qty 10

## 2015-01-31 MED ORDER — HYDROCODONE-ACETAMINOPHEN 5-325 MG PO TABS
ORAL_TABLET | ORAL | Status: AC
  Filled 2015-01-31: qty 1

## 2015-01-31 MED ORDER — IOHEXOL 300 MG/ML  SOLN
10.0000 mL | Freq: Once | INTRAMUSCULAR | Status: AC | PRN
  Administered 2015-01-31: 10 mL via INTRATHECAL

## 2015-01-31 MED ORDER — DIAZEPAM 5 MG PO TABS
10.0000 mg | ORAL_TABLET | Freq: Once | ORAL | Status: AC
  Administered 2015-01-31: 10 mg via ORAL

## 2015-01-31 MED ORDER — HYDROCODONE-ACETAMINOPHEN 5-325 MG PO TABS
1.0000 | ORAL_TABLET | ORAL | Status: DC | PRN
  Administered 2015-01-31: 1 via ORAL

## 2015-01-31 MED ORDER — ONDANSETRON HCL 4 MG/2ML IJ SOLN: 4.0000 mg | Freq: Four times a day (QID) | INTRAMUSCULAR | Status: DC | PRN

## 2015-01-31 NOTE — Procedures (Signed)
CHIEF COMPLAINT:                                          Back pain, leg pain, and weakness.    HISTORY OF PRESENT ILLNESS:                     [name] is a 54 year old, right-handed individual who tells me that she has had problems with her back for a number of years.  Up until two years ago, she had been working, however, and she had been getting around reasonably well.  However, around October of last 2014 she started having increasing problems, and at that time she was evaluated by Dr. Earle Delgado.  An MRI was performed which demonstrated that she had degenerative changes at L5-S1, some very mild changes at the upper lumber spine, but no surgical lesion was noted.  EMG and nerve conduction study was noted to be normal at that time.  He advised conservative management.  She was subsequently seen and evaluated last year at [facility] and had undergone a workup of the cervical spine.  She was found to have two areas in her neck that warranted surgical intervention, and this was done by Dr. Eliberto Delgado at that hospital.  However, she still woke up with having significant back pain and leg pain, albeit, she notes that her left leg felt a little stronger, she apparently had some weakness where she could not lift the left leg off the bed prior to her surgery.  She has had further workup of her lumbar spine that was undertaken at [facility], including discography of the four levels of her lumbar spine, all of which were reported to be non-concordant in reproducing her typical pain.  She has subsequently a series of facet blocks at L5-S1, L4-5, and L3-4, none of which were effective in even temporarily relieving her pain.  She is seen now for further evaluation of her condition to see if anything can be done to help her back and her legs.  I noted that as early as 2006 she had had an MRI of the cervical spine that showed spondylitic changes at C5-6 and C6-7 with some flattening of the spinal cord at  that level but no intrinsic cord changes.  The lumbar MRIs are summarized as noted in my evaluation.  I also noted the EMG and nerve conduction study from 2014 by Dr. Brien Delgado which showed normal conduction studies in the lower extremities.  She is now seen by Dr. Everette Delgado, a neurologist who monitors her peripheral neuropathy.    PAST MEDICAL HISTORY:                                Reveals that she has hypertension.  She has also had a heart murmur.    . Prior Operations:  Previous surgeries include the cervical spine surgery in May of last year, bladder tack-up surgery on two occasions, and a hysterectomy in the past.  She has also had a cholecystectomy.    . Medications and Allergies:  Current medications include carbamazepine 200 mg two at night and two in the morning for nerve damage, oxybutynin, ibuprofen, alprazolam, lisinopril, hydrochlorothiazide, escitalopram, aspirin, and vitamin D3.  She uses a CPAP machine at night, and hydrocodone for pain control.  SOCIAL HISTORY:                                            Reveals that she does not smoke or use alcohol.    REVIEW OF SYSTEMS:                                    Notable for balance disturbance, difficulty starting and stopping the urinary stream, arm weakness, leg weakness, back pain, leg pain, joint pain and swelling, high blood pressure, irregular pulse, heart murmur, swelling in the feet and hands, and leg pain while walking.  She has difficulty with coordination in the arms and the legs, and she notes that she has had some depression.    PHYSICAL EXAMINATION:                                She notes that she has had some weight gain over the past couple of years and currently notes that her weight is around 250 pounds and 5 feet 3 inches in height.  She notes that she can attribute about a 20+ pound weight gain in the last two years, secondary to inactivity.    On physical examination, she stands straight and erect.  She walks with a  moderate, wide-based gait, and in looking at the individual muscle strength, she has good strength in the glutei bilaterally, but she has difficulty with the tibialis anterior, more so on the left than on the right, but she has difficulty walking onto the heels on either leg.  She is able to walk onto her toes and is able to balance briefly on both toes.  Her deep tendon reflexes are 3+ in the patellae and 3+ in both Achilles.  Sensation appears diminished to vibration in the lower extremities symmetrically, worse distally than proximally.    IMPRESSION/PLAN:                                                     Patient has had a longstanding history of back pain that has been evaluated multiplely.  More recently, she was evaluated for cervical spondylosis and underwent a two-level anterior decompression and arthrodesis.  She has had continued symptoms and has had substantial workup and a number of injections in the lumbar spine to no avail.  She continues to have substantial problems with lumbar back pain and lumbar radiculopathy.  This has been symmetric, though weakness has been worse on the left side.    I advise at this point that further workup should include a myelogram, and I would do this as a total myelogram to look both at the cervical spine and the lumbar spine.  This will give [place] some idea of what the level of decompression was done in the cervical spine and whether there is any residual problem there and also give [place] a better idea of the functionality of the L5-S1 space where it appears that she has the worst of her stenosis, secondary to that chronic disc degenerative change.  It will also allow [place] to do a dynamic  component with standing flexion/extension films of the lumbar spine to give more information about those joints.  [name] notes that she has been rather frustrated with her inability to improve and inability to make forward progress.  She was hopeful that the cervical surgery would give her  better relief, and I am not certain as to what surgery was done there.  In that regard, I asked her to procure the films from [facility], both the preop and the postop films, so we can get some idea of what was done there.  In the meantime, though, we will proceed with myelography.    Pre op Dx: Cervical spondylosis and lumbar spondylosis with myelopathy Post op Dx: Cervical and lumbar spondylosis with myelopathy Procedure: Total myelogram Surgeon: Ellene Route Puncture level: L3-4 Fluid color: Clear, colorless Injection: Iohexol 300. 10 mL Findings: No obvious spondylosis in the cervical or lumbar spines no obvious cord compression thoracic spine appears clear. CT evaluation of cervical and lumbar spines

## 2015-01-31 NOTE — Discharge Instructions (Signed)
Myelography, Care After °These instructions give you information on caring for yourself after your procedure. Your doctor may also give you specific instructions. Call your doctor if you have any problems or questions after your procedure. °HOME CARE °· Rest often the first day. °· When you rest, lie flat, with your head slightly raised (elevated). °· Avoid heavy lifting and activity for 48 hours. °· You may take the bandage (dressing) off 1 day after the test. °GET HELP RIGHT AWAY IF:  °· You have a very bad headache. °· You have a fever. °MAKE SURE YOU: °· Understand these instructions. °· Will watch your condition. °· Will get help right away if you are not doing well or get worse. °Document Released: 04/07/2008 Document Revised: 11/13/2013 Document Reviewed: 10/18/2013 °Exit[employer]® Patient Information ©2015 [employer]. This information is not intended to replace advice given to you by your health care provider. Make sure you discuss any questions you have with your health care provider. ° ° °

## 2015-02-01 ENCOUNTER — Other Ambulatory Visit (HOSPITAL_COMMUNITY): Payer: Self-pay

## 2015-02-01 ENCOUNTER — Ambulatory Visit (HOSPITAL_COMMUNITY): Payer: Self-pay

## 2015-02-21 ENCOUNTER — Other Ambulatory Visit: Payer: Self-pay | Admitting: Neurological Surgery

## 2015-02-27 ENCOUNTER — Other Ambulatory Visit (HOSPITAL_COMMUNITY): Payer: 59

## 2015-02-28 ENCOUNTER — Encounter (HOSPITAL_COMMUNITY): Payer: Self-pay

## 2015-02-28 ENCOUNTER — Encounter (HOSPITAL_COMMUNITY)
Admission: RE | Admit: 2015-02-28 | Discharge: 2015-02-28 | Disposition: A | Discharge status: Home / Self Care | Payer: 59 | Source: Ambulatory Visit | Attending: Neurological Surgery | Admitting: Neurological Surgery

## 2015-02-28 DIAGNOSIS — Z01812 Encounter for preprocedural laboratory examination: Secondary | ICD-10-CM | POA: Diagnosis not present

## 2015-02-28 DIAGNOSIS — Z01810 Encounter for preprocedural cardiovascular examination: Secondary | ICD-10-CM | POA: Diagnosis not present

## 2015-02-28 DIAGNOSIS — Z0183 Encounter for blood typing: Secondary | ICD-10-CM | POA: Diagnosis not present

## 2015-02-28 HISTORY — DX: Dorsalgia, unspecified: M54.9

## 2015-02-28 HISTORY — DX: Personal history of colonic polyps: Z86.010

## 2015-02-28 HISTORY — DX: Headache: R51

## 2015-02-28 HISTORY — DX: Urgency of urination: R39.15

## 2015-02-28 HISTORY — DX: Headache, unspecified: R51.9

## 2015-02-28 HISTORY — DX: Dizziness and giddiness: R42

## 2015-02-28 HISTORY — DX: Personal history of colon polyps, unspecified: Z86.0100

## 2015-02-28 HISTORY — DX: Insomnia, unspecified: G47.00

## 2015-02-28 HISTORY — DX: Other specified postprocedural states: R11.2

## 2015-02-28 HISTORY — DX: Personal history of other diseases of the respiratory system: Z87.09

## 2015-02-28 HISTORY — DX: Unspecified osteoarthritis, unspecified site: M19.90

## 2015-02-28 HISTORY — DX: Other specified postprocedural states: Z98.890

## 2015-02-28 HISTORY — DX: Other chronic pain: G89.29

## 2015-02-28 HISTORY — DX: Frequency of micturition: R35.0

## 2015-02-28 HISTORY — DX: Weakness: R53.1

## 2015-02-28 HISTORY — DX: Nocturia: R35.1

## 2015-02-28 LAB — BASIC METABOLIC PANEL
ANION GAP: 8 (ref 5–15)
BUN: 20 mg/dL (ref 6–20)
CALCIUM: 9.6 mg/dL (ref 8.9–10.3)
CO2: 28 mmol/L (ref 22–32)
Chloride: 105 mmol/L (ref 101–111)
Creatinine, Ser: 0.92 mg/dL (ref 0.44–1.00)
GLUCOSE: 115 mg/dL — AB (ref 65–99)
POTASSIUM: 3.9 mmol/L (ref 3.5–5.1)
SODIUM: 141 mmol/L (ref 135–145)

## 2015-02-28 LAB — CBC
HEMATOCRIT: 40.1 % (ref 36.0–46.0)
HEMOGLOBIN: 13.8 g/dL (ref 12.0–15.0)
MCH: 33.2 pg (ref 26.0–34.0)
MCHC: 34.4 g/dL (ref 30.0–36.0)
MCV: 96.4 fL (ref 78.0–100.0)
Platelets: 201 10*3/uL (ref 150–400)
RBC: 4.16 MIL/uL (ref 3.87–5.11)
RDW: 12.9 % (ref 11.5–15.5)
WBC: 6.2 10*3/uL (ref 4.0–10.5)

## 2015-02-28 LAB — SURGICAL PCR SCREEN
MRSA, PCR: NEGATIVE
STAPHYLOCOCCUS AUREUS: POSITIVE — AB

## 2015-02-28 LAB — ABO/RH: ABO/RH(D): O POS

## 2015-02-28 LAB — TYPE AND SCREEN
ABO/RH(D): O POS
ANTIBODY SCREEN: NEGATIVE

## 2015-02-28 NOTE — Pre-Procedure Instructions (Signed)
MOLLIE ROSSANO  02/28/2015      [facility] - [place], [place] - [address] [place] [address] Phone: [phone] Fax: [phone]    Your procedure is scheduled on Mon, Aug 22 @ 7:30 AM  Report to [facility] Admitting at 5:30 AM  Call this number if you have problems the morning of surgery:  [phone]   Remember:  Do not eat food or drink liquids after midnight.  Take these medicines the morning of surgery with A SIP OF WATER Escitalopram(Lexapro)              Stop taking your Aspirin,Fish Oil,Ibuprofen,Herbal Medications and any Vitamins. No Goody's,BC's,or Aleve.   Do not wear jewelry, make-up or nail polish.  Do not wear lotions, powders, or perfumes.  You may wear deodorant.  Do not shave 48 hours prior to surgery.    Do not bring valuables to the hospital.  [facility] is not responsible for any belonging  valuables.  Contacts, dentures or bridgework may not be worn into surgery.  Leave your suitcase in the car.  After surgery it may be brought to your room.  For patients admitted to the hospital, discharge time will be determined by your treatment team.  Patients discharged the day of surgery will not be allowed to drive home.    Special instructions:  [facility] - Preparing for Surgery  Before surgery, you can play an important role.  Because skin is not sterile, your skin needs to be as free of germs as possible.  You can reduce the number of germs on you skin by washing with CHG (chlorahexidine gluconate) soap before surgery.  CHG is an antiseptic cleaner which kills germs and bonds with the skin to continue killing germs even after washing.  Please DO NOT use if you have an allergy to CHG or antibacterial soaps.  If your skin becomes reddened/irritated stop using the CHG and inform your nurse when you arrive at Short Stay.  Do not shave (including legs and underarms) for at least 48 hours prior to the first  CHG shower.  You may shave your face.  Please follow these instructions carefully:   1.  Shower with CHG Soap the night before surgery and the                                morning of Surgery.  2.  If you choose to wash your hair, wash your hair first as usual with your       normal shampoo.  3.  After you shampoo, rinse your hair and body thoroughly to remove the                      Shampoo.  4.  Use CHG as you would any other liquid soap.  You can apply chg directly       to the skin and wash gently with scrungie or a clean washcloth.  5.  Apply the CHG Soap to your body ONLY FROM THE NECK DOWN.        Do not use on open wounds or open sores.  Avoid contact with your eyes,       ears, mouth and genitals (private parts).  Wash genitals (private parts)       with your normal soap.  6.  Wash thoroughly, paying special  attention to the area where your surgery        will be performed.  7.  Thoroughly rinse your body with warm water from the neck down.  8.  DO NOT shower/wash with your normal soap after using and rinsing off       the CHG Soap.  9.  Pat yourself dry with a clean towel.            10.  Wear clean pajamas.            11.  Place clean sheets on your bed the night of your first shower and do not        sleep with pets.  Day of Surgery  Do not apply any lotions/deoderants the morning of surgery.  Please wear clean clothes to the hospital/surgery center.    Please read over the following fact sheets that you were given. Pain Booklet, Coughing and Deep Breathing, Blood Transfusion Information, MRSA Information and Surgical Site Infection Prevention

## 2015-02-28 NOTE — Progress Notes (Signed)
Mupirocin Ointment Rx called into [facility] for positive PCR of Staph. Pt notified and voiced understanding.

## 2015-02-28 NOTE — Progress Notes (Addendum)
Cardiologist is Dr.Kalil with [facility]-last visit in 01/2014-was told only to follow up if needed  Sees PA [place] in [place]  EKG denies having one in past yr  CXR denies ever having one  Echo > 71yrs ago   Stress test done in 2015 and to request from [place] cath >57yrs ago  Sleep study done 7+yrs ago

## 2015-03-04 ENCOUNTER — Inpatient Hospital Stay (HOSPITAL_COMMUNITY)
Admission: RE | Admit: 2015-03-04 | Discharge: 2015-03-05 | DRG: 460 | Disposition: A | Discharge status: Home / Self Care | Payer: 59 | Source: Ambulatory Visit | Attending: Neurological Surgery | Admitting: Neurological Surgery

## 2015-03-04 ENCOUNTER — Inpatient Hospital Stay (HOSPITAL_COMMUNITY): Payer: 59

## 2015-03-04 ENCOUNTER — Encounter (HOSPITAL_COMMUNITY): Payer: Self-pay | Admitting: *Deleted

## 2015-03-04 ENCOUNTER — Encounter (HOSPITAL_COMMUNITY)
Admission: RE | Disposition: A | Discharge status: Home / Self Care | Payer: 59 | Source: Ambulatory Visit | Attending: Neurological Surgery

## 2015-03-04 ENCOUNTER — Inpatient Hospital Stay (HOSPITAL_COMMUNITY): Payer: 59 | Admitting: Anesthesiology

## 2015-03-04 DIAGNOSIS — G4730 Sleep apnea, unspecified: Secondary | ICD-10-CM | POA: Diagnosis present

## 2015-03-04 DIAGNOSIS — Z419 Encounter for procedure for purposes other than remedying health state, unspecified: Secondary | ICD-10-CM

## 2015-03-04 DIAGNOSIS — I10 Essential (primary) hypertension: Secondary | ICD-10-CM | POA: Diagnosis present

## 2015-03-04 DIAGNOSIS — Z6841 Body Mass Index (BMI) 40.0 and over, adult: Secondary | ICD-10-CM | POA: Diagnosis not present

## 2015-03-04 DIAGNOSIS — M549 Dorsalgia, unspecified: Secondary | ICD-10-CM | POA: Diagnosis present

## 2015-03-04 DIAGNOSIS — M5417 Radiculopathy, lumbosacral region: Principal | ICD-10-CM | POA: Diagnosis present

## 2015-03-04 DIAGNOSIS — M48062 Spinal stenosis, lumbar region with neurogenic claudication: Secondary | ICD-10-CM | POA: Diagnosis present

## 2015-03-04 DIAGNOSIS — M4807 Spinal stenosis, lumbosacral region: Secondary | ICD-10-CM | POA: Diagnosis present

## 2015-03-04 SURGERY — POSTERIOR LUMBAR FUSION 1 LEVEL
Anesthesia: General | Site: Back

## 2015-03-04 MED ORDER — DEXAMETHASONE SODIUM PHOSPHATE 10 MG/ML IJ SOLN
INTRAMUSCULAR | Status: DC | PRN
  Administered 2015-03-04: 10 mg via INTRAVENOUS

## 2015-03-04 MED ORDER — KETOROLAC TROMETHAMINE 15 MG/ML IJ SOLN
15.0000 mg | Freq: Four times a day (QID) | INTRAMUSCULAR | Status: DC
  Administered 2015-03-04 – 2015-03-05 (×4): 15 mg via INTRAVENOUS
  Filled 2015-03-04 (×5): qty 1

## 2015-03-04 MED ORDER — EPHEDRINE SULFATE 50 MG/ML IJ SOLN
INTRAMUSCULAR | Status: DC | PRN
  Administered 2015-03-04: 10 mg via INTRAVENOUS

## 2015-03-04 MED ORDER — ARTIFICIAL TEARS OP OINT
TOPICAL_OINTMENT | OPHTHALMIC | Status: DC | PRN
  Administered 2015-03-04: 1 via OPHTHALMIC

## 2015-03-04 MED ORDER — ROCURONIUM BROMIDE 100 MG/10ML IV SOLN
INTRAVENOUS | Status: DC | PRN
  Administered 2015-03-04: 50 mg via INTRAVENOUS
  Administered 2015-03-04 (×3): 10 mg via INTRAVENOUS

## 2015-03-04 MED ORDER — FENTANYL CITRATE (PF) 250 MCG/5ML IJ SOLN
INTRAMUSCULAR | Status: AC
  Filled 2015-03-04: qty 5

## 2015-03-04 MED ORDER — PHENOL 1.4 % MT LIQD
1.0000 | OROMUCOSAL | Status: DC | PRN
Start: 2015-03-04 — End: 2015-03-05

## 2015-03-04 MED ORDER — SUCCINYLCHOLINE CHLORIDE 20 MG/ML IJ SOLN
INTRAMUSCULAR | Status: AC
  Filled 2015-03-04: qty 1

## 2015-03-04 MED ORDER — FENTANYL CITRATE (PF) 100 MCG/2ML IJ SOLN
INTRAMUSCULAR | Status: DC | PRN
  Administered 2015-03-04 (×5): 50 ug via INTRAVENOUS

## 2015-03-04 MED ORDER — DOCUSATE SODIUM 100 MG PO CAPS
100.0000 mg | ORAL_CAPSULE | Freq: Two times a day (BID) | ORAL | Status: DC
  Administered 2015-03-04 – 2015-03-05 (×3): 100 mg via ORAL
  Filled 2015-03-04 (×3): qty 1

## 2015-03-04 MED ORDER — ONDANSETRON HCL 4 MG/2ML IJ SOLN
INTRAMUSCULAR | Status: DC | PRN
  Administered 2015-03-04: 4 mg via INTRAVENOUS

## 2015-03-04 MED ORDER — HYDROCHLOROTHIAZIDE 25 MG PO TABS
25.0000 mg | ORAL_TABLET | Freq: Every day | ORAL | Status: DC
  Administered 2015-03-04 – 2015-03-05 (×2): 25 mg via ORAL
  Filled 2015-03-04 (×2): qty 1

## 2015-03-04 MED ORDER — FLEET ENEMA 7-19 GM/118ML RE ENEM: 1.0000 | ENEMA | Freq: Once | RECTAL | Status: DC | PRN

## 2015-03-04 MED ORDER — NEOSTIGMINE METHYLSULFATE 10 MG/10ML IV SOLN
INTRAVENOUS | Status: DC | PRN
  Administered 2015-03-04: 4 mg via INTRAVENOUS

## 2015-03-04 MED ORDER — ALBUMIN HUMAN 5 % IV SOLN
INTRAVENOUS | Status: DC | PRN
  Administered 2015-03-04: 11:00:00 via INTRAVENOUS

## 2015-03-04 MED ORDER — GLYCOPYRROLATE 0.2 MG/ML IJ SOLN
INTRAMUSCULAR | Status: DC | PRN
  Administered 2015-03-04: 0.6 mg via INTRAVENOUS

## 2015-03-04 MED ORDER — HYDROMORPHONE HCL 1 MG/ML IJ SOLN
INTRAMUSCULAR | Status: AC
  Filled 2015-03-04: qty 1

## 2015-03-04 MED ORDER — CEFAZOLIN SODIUM-DEXTROSE 2-3 GM-% IV SOLR
INTRAVENOUS | Status: AC
  Administered 2015-03-04 (×2): 2 g via INTRAVENOUS
  Filled 2015-03-04: qty 50

## 2015-03-04 MED ORDER — THROMBIN 5000 UNITS EX SOLR
OROMUCOSAL | Status: DC | PRN
  Administered 2015-03-04 (×2): via TOPICAL

## 2015-03-04 MED ORDER — LISINOPRIL 10 MG PO TABS
10.0000 mg | ORAL_TABLET | Freq: Every day | ORAL | Status: DC
  Administered 2015-03-04 – 2015-03-05 (×2): 10 mg via ORAL
  Filled 2015-03-04 (×2): qty 1

## 2015-03-04 MED ORDER — PHENYLEPHRINE 40 MCG/ML (10ML) SYRINGE FOR IV PUSH (FOR BLOOD PRESSURE SUPPORT)
PREFILLED_SYRINGE | INTRAVENOUS | Status: AC
  Filled 2015-03-04: qty 10

## 2015-03-04 MED ORDER — METHOCARBAMOL 500 MG PO TABS
500.0000 mg | ORAL_TABLET | Freq: Four times a day (QID) | ORAL | Status: DC | PRN
  Administered 2015-03-04: 500 mg via ORAL
  Filled 2015-03-04: qty 1

## 2015-03-04 MED ORDER — HYDROMORPHONE HCL 1 MG/ML IJ SOLN
0.5000 mg | INTRAMUSCULAR | Status: DC | PRN
  Administered 2015-03-04: 1 mg via INTRAVENOUS
  Filled 2015-03-04: qty 1

## 2015-03-04 MED ORDER — BUPIVACAINE HCL (PF) 0.5 % IJ SOLN
INTRAMUSCULAR | Status: DC | PRN
  Administered 2015-03-04: 5 mL

## 2015-03-04 MED ORDER — OXYBUTYNIN CHLORIDE 5 MG PO TABS
10.0000 mg | ORAL_TABLET | Freq: Every day | ORAL | Status: DC
  Administered 2015-03-04: 10 mg via ORAL
  Filled 2015-03-04 (×2): qty 2

## 2015-03-04 MED ORDER — PROPOFOL 10 MG/ML IV BOLUS
INTRAVENOUS | Status: DC | PRN
  Administered 2015-03-04: 200 mg via INTRAVENOUS

## 2015-03-04 MED ORDER — MIDAZOLAM HCL 2 MG/2ML IJ SOLN
INTRAMUSCULAR | Status: AC
  Filled 2015-03-04: qty 4

## 2015-03-04 MED ORDER — LIDOCAINE HCL (CARDIAC) 20 MG/ML IV SOLN
INTRAVENOUS | Status: AC
  Filled 2015-03-04: qty 5

## 2015-03-04 MED ORDER — MIDAZOLAM HCL 5 MG/5ML IJ SOLN
INTRAMUSCULAR | Status: DC | PRN
  Administered 2015-03-04: 2 mg via INTRAVENOUS

## 2015-03-04 MED ORDER — SENNA 8.6 MG PO TABS
1.0000 | ORAL_TABLET | Freq: Two times a day (BID) | ORAL | Status: DC
  Administered 2015-03-04 – 2015-03-05 (×3): 8.6 mg via ORAL
  Filled 2015-03-04 (×4): qty 1

## 2015-03-04 MED ORDER — LIDOCAINE-EPINEPHRINE 1 %-1:100000 IJ SOLN
INTRAMUSCULAR | Status: DC | PRN
  Administered 2015-03-04: 5 mL

## 2015-03-04 MED ORDER — EPHEDRINE SULFATE 50 MG/ML IJ SOLN
INTRAMUSCULAR | Status: AC
  Filled 2015-03-04: qty 1

## 2015-03-04 MED ORDER — ROCURONIUM BROMIDE 50 MG/5ML IV SOLN
INTRAVENOUS | Status: AC
  Filled 2015-03-04: qty 2

## 2015-03-04 MED ORDER — SCOPOLAMINE 1 MG/3DAYS TD PT72
MEDICATED_PATCH | TRANSDERMAL | Status: AC
  Administered 2015-03-04: 1 via TRANSDERMAL
  Filled 2015-03-04: qty 1

## 2015-03-04 MED ORDER — PROPOFOL 10 MG/ML IV BOLUS
INTRAVENOUS | Status: AC
  Filled 2015-03-04: qty 20

## 2015-03-04 MED ORDER — DEXAMETHASONE SODIUM PHOSPHATE 10 MG/ML IJ SOLN
INTRAMUSCULAR | Status: AC
  Filled 2015-03-04: qty 1

## 2015-03-04 MED ORDER — PHENYLEPHRINE HCL 10 MG/ML IJ SOLN
INTRAMUSCULAR | Status: AC
  Filled 2015-03-04: qty 1

## 2015-03-04 MED ORDER — ALPRAZOLAM 0.5 MG PO TABS
0.5000 mg | ORAL_TABLET | Freq: Every day | ORAL | Status: DC
  Administered 2015-03-04: 0.5 mg via ORAL
  Filled 2015-03-04: qty 1

## 2015-03-04 MED ORDER — SUCCINYLCHOLINE CHLORIDE 20 MG/ML IJ SOLN
INTRAMUSCULAR | Status: DC | PRN
  Administered 2015-03-04: 120 mg via INTRAVENOUS

## 2015-03-04 MED ORDER — ARTIFICIAL TEARS OP OINT
TOPICAL_OINTMENT | OPHTHALMIC | Status: AC
  Filled 2015-03-04: qty 3.5

## 2015-03-04 MED ORDER — ALUM & MAG HYDROXIDE-SIMETH 200-200-20 MG/5ML PO SUSP: 30.0000 mL | Freq: Four times a day (QID) | ORAL | Status: DC | PRN

## 2015-03-04 MED ORDER — BISACODYL 10 MG RE SUPP: 10.0000 mg | Freq: Every day | RECTAL | Status: DC | PRN

## 2015-03-04 MED ORDER — BACITRACIN 50000 UNITS IM SOLR
INTRAMUSCULAR | Status: DC | PRN
  Administered 2015-03-04: 09:00:00

## 2015-03-04 MED ORDER — HYDROMORPHONE HCL 1 MG/ML IJ SOLN
0.2500 mg | INTRAMUSCULAR | Status: DC | PRN
  Administered 2015-03-04 (×2): 0.5 mg via INTRAVENOUS

## 2015-03-04 MED ORDER — OXYCODONE-ACETAMINOPHEN 5-325 MG PO TABS
1.0000 | ORAL_TABLET | ORAL | Status: DC | PRN
  Administered 2015-03-04 – 2015-03-05 (×2): 2 via ORAL
  Filled 2015-03-04 (×2): qty 2

## 2015-03-04 MED ORDER — ONDANSETRON HCL 4 MG/2ML IJ SOLN
INTRAMUSCULAR | Status: AC
  Filled 2015-03-04: qty 2

## 2015-03-04 MED ORDER — OXYCODONE HCL 5 MG PO TABS: 5.0000 mg | ORAL_TABLET | Freq: Once | ORAL | Status: DC | PRN

## 2015-03-04 MED ORDER — LIDOCAINE HCL (CARDIAC) 20 MG/ML IV SOLN
INTRAVENOUS | Status: DC | PRN
  Administered 2015-03-04: 100 mg via INTRAVENOUS

## 2015-03-04 MED ORDER — 0.9 % SODIUM CHLORIDE (POUR BTL) OPTIME
TOPICAL | Status: DC | PRN
  Administered 2015-03-04: 1000 mL

## 2015-03-04 MED ORDER — GLYCOPYRROLATE 0.2 MG/ML IJ SOLN
INTRAMUSCULAR | Status: AC
  Filled 2015-03-04: qty 4

## 2015-03-04 MED ORDER — THROMBIN 20000 UNITS EX SOLR
CUTANEOUS | Status: DC | PRN
  Administered 2015-03-04: 09:00:00 via TOPICAL

## 2015-03-04 MED ORDER — NEOSTIGMINE METHYLSULFATE 10 MG/10ML IV SOLN
INTRAVENOUS | Status: AC
  Filled 2015-03-04: qty 1

## 2015-03-04 MED ORDER — MENTHOL 3 MG MT LOZG: 1.0000 | LOZENGE | OROMUCOSAL | Status: DC | PRN

## 2015-03-04 MED ORDER — POLYETHYLENE GLYCOL 3350 17 G PO PACK: 17.0000 g | PACK | Freq: Every day | ORAL | Status: DC | PRN

## 2015-03-04 MED ORDER — SODIUM CHLORIDE 0.9 % IV SOLN: 250.0000 mL | INTRAVENOUS | Status: DC

## 2015-03-04 MED ORDER — MUPIROCIN 2 % EX OINT
1.0000 "application " | TOPICAL_OINTMENT | Freq: Two times a day (BID) | CUTANEOUS | Status: AC
  Administered 2015-03-04 – 2015-03-05 (×2): 1 via NASAL
  Filled 2015-03-04: qty 22

## 2015-03-04 MED ORDER — PROMETHAZINE HCL 25 MG/ML IJ SOLN
6.2500 mg | INTRAMUSCULAR | Status: DC | PRN
Start: 2015-03-04 — End: 2015-03-04

## 2015-03-04 MED ORDER — SODIUM CHLORIDE 0.9 % IJ SOLN: 3.0000 mL | Freq: Two times a day (BID) | INTRAMUSCULAR | Status: DC

## 2015-03-04 MED ORDER — SODIUM CHLORIDE 0.9 % IJ SOLN: 3.0000 mL | INTRAMUSCULAR | Status: DC | PRN

## 2015-03-04 MED ORDER — OXYCODONE HCL 5 MG/5ML PO SOLN
5.0000 mg | Freq: Once | ORAL | Status: DC | PRN
Start: 2015-03-04 — End: 2015-03-04

## 2015-03-04 MED ORDER — ACETAMINOPHEN 650 MG RE SUPP: 650.0000 mg | RECTAL | Status: DC | PRN

## 2015-03-04 MED ORDER — PHENYLEPHRINE HCL 10 MG/ML IJ SOLN
INTRAMUSCULAR | Status: DC | PRN
  Administered 2015-03-04: 80 ug via INTRAVENOUS
  Administered 2015-03-04: 40 ug via INTRAVENOUS
  Administered 2015-03-04: 80 ug via INTRAVENOUS
  Administered 2015-03-04 (×2): 40 ug via INTRAVENOUS

## 2015-03-04 MED ORDER — ACETAMINOPHEN 325 MG PO TABS: 650.0000 mg | ORAL_TABLET | ORAL | Status: DC | PRN

## 2015-03-04 MED ORDER — SODIUM CHLORIDE 0.9 % IJ SOLN
INTRAMUSCULAR | Status: AC
  Filled 2015-03-04: qty 10

## 2015-03-04 MED ORDER — ESCITALOPRAM OXALATE 20 MG PO TABS
20.0000 mg | ORAL_TABLET | Freq: Every day | ORAL | Status: DC
  Administered 2015-03-04 – 2015-03-05 (×2): 20 mg via ORAL
  Filled 2015-03-04 (×2): qty 1

## 2015-03-04 MED ORDER — ONDANSETRON HCL 4 MG/2ML IJ SOLN
4.0000 mg | INTRAMUSCULAR | Status: DC | PRN
  Administered 2015-03-04: 4 mg via INTRAVENOUS
  Filled 2015-03-04: qty 2

## 2015-03-04 MED ORDER — LACTATED RINGERS IV SOLN
INTRAVENOUS | Status: DC | PRN
  Administered 2015-03-04 (×3): via INTRAVENOUS

## 2015-03-04 MED ORDER — SODIUM CHLORIDE 0.9 % IV SOLN: INTRAVENOUS | Status: DC

## 2015-03-04 MED ORDER — METHOCARBAMOL 1000 MG/10ML IJ SOLN
500.0000 mg | Freq: Four times a day (QID) | INTRAVENOUS | Status: DC | PRN
  Filled 2015-03-04: qty 5

## 2015-03-04 MED ORDER — HYDROCODONE-ACETAMINOPHEN 5-325 MG PO TABS: 1.0000 | ORAL_TABLET | ORAL | Status: DC | PRN

## 2015-03-04 SURGICAL SUPPLY — 63 items
BAG DECANTER FOR FLEXI CONT (MISCELLANEOUS) ×3 IMPLANT
BLADE CLIPPER SURG (BLADE) IMPLANT
BONE MATRIX OSTEOCEL PRO MED (Bone Implant) ×6 IMPLANT
BUR MATCHSTICK NEURO 3.0 LAGG (BURR) ×3 IMPLANT
CAGE COROENT LRG MP 8X9X28-12 (Cage) ×6 IMPLANT
CANISTER SUCT 3000ML PPV (MISCELLANEOUS) ×3 IMPLANT
CONT SPEC 4OZ CLIKSEAL STRL BL (MISCELLANEOUS) ×3 IMPLANT
COVER BACK TABLE 60X90IN (DRAPES) ×3 IMPLANT
DECANTER SPIKE VIAL GLASS SM (MISCELLANEOUS) ×3 IMPLANT
DERMABOND ADHESIVE PROPEN (GAUZE/BANDAGES/DRESSINGS) ×2
DERMABOND ADVANCED (GAUZE/BANDAGES/DRESSINGS) ×2
DERMABOND ADVANCED .7 DNX12 (GAUZE/BANDAGES/DRESSINGS) ×1 IMPLANT
DERMABOND ADVANCED .7 DNX6 (GAUZE/BANDAGES/DRESSINGS) ×1 IMPLANT
DRAPE C-ARM 42X72 X-RAY (DRAPES) ×6 IMPLANT
DRAPE LAPAROTOMY 100X72X124 (DRAPES) ×3 IMPLANT
DRAPE MICROSCOPE LEICA (MISCELLANEOUS) ×3 IMPLANT
DRAPE POUCH INSTRU U-SHP 10X18 (DRAPES) ×3 IMPLANT
DRAPE PROXIMA HALF (DRAPES) IMPLANT
DRSG OPSITE POSTOP 4X6 (GAUZE/BANDAGES/DRESSINGS) ×6 IMPLANT
DURAPREP 26ML APPLICATOR (WOUND CARE) ×3 IMPLANT
ELECT REM PT RETURN 9FT ADLT (ELECTROSURGICAL) ×3
ELECTRODE REM PT RTRN 9FT ADLT (ELECTROSURGICAL) ×1 IMPLANT
GAUZE SPONGE 4X4 12PLY STRL (GAUZE/BANDAGES/DRESSINGS) ×3 IMPLANT
GAUZE SPONGE 4X4 16PLY XRAY LF (GAUZE/BANDAGES/DRESSINGS) ×3 IMPLANT
GLOVE BIOGEL PI IND STRL 8.5 (GLOVE) ×2 IMPLANT
GLOVE BIOGEL PI INDICATOR 8.5 (GLOVE) ×4
GLOVE ECLIPSE 8.5 STRL (GLOVE) ×6 IMPLANT
GLOVE EXAM NITRILE LRG STRL (GLOVE) IMPLANT
GLOVE EXAM NITRILE MD LF STRL (GLOVE) IMPLANT
GLOVE EXAM NITRILE XL STR (GLOVE) IMPLANT
GLOVE EXAM NITRILE XS STR PU (GLOVE) IMPLANT
GOWN STRL REUS W/ TWL LRG LVL3 (GOWN DISPOSABLE) ×2 IMPLANT
GOWN STRL REUS W/ TWL XL LVL3 (GOWN DISPOSABLE) ×1 IMPLANT
GOWN STRL REUS W/TWL 2XL LVL3 (GOWN DISPOSABLE) ×6 IMPLANT
GOWN STRL REUS W/TWL LRG LVL3 (GOWN DISPOSABLE) ×4
GOWN STRL REUS W/TWL XL LVL3 (GOWN DISPOSABLE) ×2
HEMOSTAT POWDER KIT SURGIFOAM (HEMOSTASIS) IMPLANT
KIT BASIN OR (CUSTOM PROCEDURE TRAY) ×3 IMPLANT
KIT ROOM TURNOVER OR (KITS) ×3 IMPLANT
MILL MEDIUM DISP (BLADE) ×3 IMPLANT
NEEDLE HYPO 22GX1.5 SAFETY (NEEDLE) ×3 IMPLANT
NS IRRIG 1000ML POUR BTL (IV SOLUTION) ×3 IMPLANT
PACK LAMINECTOMY NEURO (CUSTOM PROCEDURE TRAY) ×3 IMPLANT
PAD ARMBOARD 7.5X6 YLW CONV (MISCELLANEOUS) ×12 IMPLANT
PATTIES SURGICAL .5 X1 (DISPOSABLE) IMPLANT
ROD RELINE-O LORD 5.5X35MM (Rod) ×6 IMPLANT
RUBBERBAND STERILE (MISCELLANEOUS) ×6 IMPLANT
SCREW LOCK RELINE 5.5 TULIP (Screw) ×12 IMPLANT
SCREW RELINE-O POLY 6.5X45 (Screw) ×12 IMPLANT
SPONGE LAP 4X18 X RAY DECT (DISPOSABLE) IMPLANT
SPONGE SURGIFOAM ABS GEL 100 (HEMOSTASIS) ×3 IMPLANT
SUT PROLENE 6 0 BV (SUTURE) ×9 IMPLANT
SUT VIC AB 1 CT1 18XBRD ANBCTR (SUTURE) ×1 IMPLANT
SUT VIC AB 1 CT1 8-18 (SUTURE) ×2
SUT VIC AB 2-0 CP2 18 (SUTURE) ×3 IMPLANT
SUT VIC AB 3-0 SH 8-18 (SUTURE) ×3 IMPLANT
SYR 20ML ECCENTRIC (SYRINGE) IMPLANT
SYR 3ML LL SCALE MARK (SYRINGE) ×12 IMPLANT
TOWEL OR 17X24 6PK STRL BLUE (TOWEL DISPOSABLE) ×3 IMPLANT
TOWEL OR 17X26 10 PK STRL BLUE (TOWEL DISPOSABLE) ×3 IMPLANT
TRAP SPECIMEN MUCOUS 40CC (MISCELLANEOUS) ×3 IMPLANT
TRAY FOLEY W/METER SILVER 14FR (SET/KITS/TRAYS/PACK) ×3 IMPLANT
WATER STERILE IRR 1000ML POUR (IV SOLUTION) ×3 IMPLANT

## 2015-03-04 NOTE — Anesthesia Postprocedure Evaluation (Signed)
  Anesthesia Post-op Note  Patient: [name]  Procedure(s) Performed: Procedure(s) (LRB): Decompression of Lumbar five- Sacral One disc spaceand nerve root  Posterior lumbar interbody fusion with autograft and allograft. pedicle screw fixation (N/A)  Patient Location: PACU  Anesthesia Type: General  Level of Consciousness: awake and alert   Airway and Oxygen Therapy: Patient Spontanous Breathing  Post-op Pain: mild  Post-op Assessment: Post-op Vital signs reviewed, Patient's Cardiovascular Status Stable, Respiratory Function Stable, Patent Airway and No signs of Nausea or vomiting  Last Vitals:  Filed Vitals:   03/04/15 1328  BP: 148/67  Pulse: 84  Temp: 36.8 C  Resp: 18    Post-op Vital Signs: stable   Complications: No apparent anesthesia complications

## 2015-03-04 NOTE — Anesthesia Procedure Notes (Signed)
Procedure Name: Intubation Date/Time: 03/04/2015 7:51 AM Performed by: Scheryl Darter Pre-anesthesia Checklist: Patient identified, Emergency Drugs available, Suction available, Patient being monitored and Timeout performed Patient Re-evaluated:Patient Re-evaluated prior to inductionOxygen Delivery Method: Circle system utilized Preoxygenation: Pre-oxygenation with 100% oxygen Intubation Type: IV induction Ventilation: Mask ventilation without difficulty Laryngoscope Size: Miller and 2 Grade View: Grade I Tube type: Oral Tube size: 7.5 mm Number of attempts: 1 Airway Equipment and Method: Stylet Placement Confirmation: ETT inserted through vocal cords under direct vision,  positive ETCO2 and breath sounds checked- equal and bilateral Secured at: 22 cm Tube secured with: Tape Dental Injury: Teeth and Oropharynx as per pre-operative assessment

## 2015-03-04 NOTE — Evaluation (Addendum)
Occupational Therapy Evaluation Patient Details Name: [name] MRN: [id number] DOB: 1960/10/18 Today's Date: 03/04/2015    History of Present Illness 54 y.o. s/p decompression of lumbar five-sacral One disc space and nerve root,posterior lumbar interbody fusion with autograft and allograft. pedicle screw fixation    Clinical Impression   Pt s/p above. Pt requiring assist with ADLs, at times, PTA. Feel pt will benefit from acute OT to increase independence prior to d/c. Plan to see pt tomorrow to reinforce precautions and practice LB dressing.     Follow Up Recommendations  No OT follow up    Equipment Recommendations  Other (comment) (TBD-may benefit from AE)    Recommendations for Other Services       Precautions / Restrictions Precautions Precautions: Back Precaution Booklet Issued: Yes (comment) Precaution Comments: educated on back precautions Required Braces or Orthoses: Spinal Brace Spinal Brace: Lumbar corset Restrictions Weight Bearing Restrictions: No      Mobility Bed Mobility Overal bed mobility: Needs Assistance Bed Mobility: Rolling;Sidelying to Sit;Sit to Supine Rolling: Supervision Sidelying to sit: Supervision   Sit to supine: Supervision   General bed mobility comments: cues for technique. Discussed with pt about going to sidelying and then rolling for returning to bed.  Transfers                 General transfer comment: not assessed-pt nauseous    Balance  No LOB sitting EOB.                                          ADL Overall ADL's : Needs assistance/impaired     Grooming: Set up;Supervision/safety;Bed level             Upper Body Dressing Details (indicate cue type and reason): pt verbalized she knows how to don back brace.     Toilet Transfer:  (did not stand from bed-nauseous)             General ADL Comments: OT educated on back brace. Discussed incorporating precautions into functional  activities. Educated on LB ADL techniques and also explained AE is available if needed. Educated on what pt could use for toilet hygiene, if needed, and suggested baby wipes. Discussed tub transfer technique and explained that is also the technique for car transfer. Discussed positioning of pillows. Recommended pt not to sit in chair for longer than 45 minutes at a time.     Vision     Perception     Praxis      Pertinent Vitals/Pain Pain Assessment: 0-10 Pain Score: 4  Pain Location: back  Pain Descriptors / Indicators: Sore Pain Intervention(s): Monitored during session;Repositioned     Hand Dominance Right   Extremity/Trunk Assessment Upper Extremity Assessment Upper Extremity Assessment: Overall WFL for tasks assessed   Lower Extremity Assessment Lower Extremity Assessment: Defer to PT evaluation       Communication Communication Communication: No difficulties   Cognition Arousal/Alertness: Awake/alert Behavior During Therapy: WFL for tasks assessed/performed Overall Cognitive Status: Within Functional Limits for tasks assessed                     General Comments       Exercises       Shoulder Instructions      Home Living Family/patient expects to be discharged to:: Private residence Living Arrangements: Spouse/significant other Available Help at Discharge:  Family;Available 24 hours/day Type of Home: Mobile home Home Access: Stairs to enter Entrance Stairs-Number of Steps: 5 Entrance Stairs-Rails: Right;Left;Can reach both Home Layout: One level     Bathroom Shower/Tub: Teacher, early years/pre: Standard     Home Equipment: Environmental consultant - 2 wheels;Hand held shower head;Tub bench          Prior Functioning/Environment Level of Independence: Needs assistance    ADL's / Homemaking Assistance Needed: assist with LB bathing/dressing at times        OT Diagnosis: Acute pain   OT Problem List: Decreased activity tolerance;Decreased  knowledge of use of DME or AE;Decreased knowledge of precautions;Pain   OT Treatment/Interventions: Self-care/ADL training;DME and/or AE instruction;Therapeutic activities;Patient/family education;Balance training    OT Goals(Current goals can be found in the care plan section) Acute Rehab OT Goals Patient Stated Goal: not assessed OT Goal Formulation: With patient Time For Goal Achievement: 03/11/15 Potential to Achieve Goals: Good ADL Goals Pt Will Perform Lower Body Dressing: with set-up;sit to/from stand Pt Will Transfer to Toilet: with supervision;ambulating (3 in 1 over commode) Pt Will Perform Toileting - Clothing Manipulation and hygiene: sit to/from stand;with supervision;with set-up Additional ADL Goal #1: Pt will independently verbalize 3/3 back precautions and maintain during session.  OT Frequency: Min 2X/week   Barriers to D/C:            Co-evaluation              End of Session Equipment Utilized During Treatment: Other (comment) (educated on back brace) Nurse Communication: Other (comment) (pt nauseous; no equipment needs; no OT followup)  Activity Tolerance: Other (comment) (nauseous) Patient left: in bed;with call bell/phone within reach;with family/visitor present; SCDs reapplied   Time: 5732-2025 OT Time Calculation (min): 17 min Charges:  OT General Charges $OT Visit: 1 Procedure OT Evaluation $Initial OT Evaluation Tier I: 1 Procedure G-CodesBenito Mccreedy OTR/L [id number] 03/04/2015, 3:02 PM

## 2015-03-04 NOTE — Anesthesia Preprocedure Evaluation (Addendum)
Anesthesia Evaluation  Patient identified by MRN, date of birth, ID band Patient awake    Reviewed: Allergy & Precautions, H&P , NPO status , Patient's Chart, lab work & pertinent test results  History of Anesthesia Complications (+) PONVNegative for: history of anesthetic complications  Airway Mallampati: I  TM Distance: >3 FB Neck ROM: full    Dental no notable dental hx.    Pulmonary neg pulmonary ROS, sleep apnea and Continuous Positive Airway Pressure Ventilation ,  breath sounds clear to auscultation  Pulmonary exam normal       Cardiovascular hypertension, Pt. on medications Normal cardiovascular examRhythm:regular Rate:Normal     Neuro/Psych  Headaches, Depression    GI/Hepatic negative GI ROS, Neg liver ROS,   Endo/Other  Morbid obesity  Renal/GU negative Renal ROS     Musculoskeletal  (+) Arthritis -,   Abdominal (+) + obese,   Peds  Hematology negative hematology ROS (+)   Anesthesia Other Findings Last saw cardiologist 01/2014 and was told to follow up as needed, no new cardiac or pulmonary symptoms  Reproductive/Obstetrics negative OB ROS                           Anesthesia Physical Anesthesia Plan  ASA: III  Anesthesia Plan: General   Post-op Pain Management:    Induction: Intravenous  Airway Management Planned: Oral ETT  Additional Equipment:   Intra-op Plan:   Post-operative Plan:   Informed Consent: I have reviewed the patients History and Physical, chart, labs and discussed the procedure including the risks, benefits and alternatives for the proposed anesthesia with the patient or authorized representative who has indicated his/her understanding and acceptance.     Plan Discussed with: Anesthesiologist, CRNA and Surgeon  Anesthesia Plan Comments: (Given hx of anterior cervical fusion will have glidescope in room Multimodal therapy given hx of PONV,  consider opioids+tylenol+decadron+/- ketamine in addition to low dose background propofol infusion CPAP settings are 26mmHg )       Anesthesia Quick Evaluation

## 2015-03-04 NOTE — Progress Notes (Signed)
Patient has CPAP set at 8 cmH2O with 2 LPM nasal cannula. Patient has a Medium FFM. Patient states she is not ready to go on at this time stated will call when ready.

## 2015-03-04 NOTE — Op Note (Signed)
Date of surgery: 03/04/2015 Procedure: L5-S1 decompressive laminectomy decompression of  L5 and S1 nerve roots, posterior lumbar interbody arthrodesis with peek spacers local autograft and allograft, pedicle screw fixation L5-S1, posterior lateral arthrodesis L5-S1  Surgeon: Kristeen Miss M.D.  Asst.: Francesca Jewett M.D.  Indications: Patient is [name] is a 54 y.o. female who who's had significant back pain and lumbar radiculopathy for over a years period time. A lumbar myelogram demonstrates advanced spondylolisthesis with high-grade canal stenosis. she was advised regarding surgical intervention. She also has a calcified disc herniation at the L5-S1 level  Procedure: The patient was brought to the operating room supine on a stretcher. After the smooth induction of general endotracheal anesthesia she was turned prone and the back was prepped with alcohol and DuraPrep. The back was then draped sterilely. A midline incision was created and carried down to the lumbar dorsal fascia. A localizing radiograph identified the  L5 spinous processes. A subligamentous dissection was created at  L5 to expose the interlaminar space at L5-S1 and the facet joints over the L5-S1 interspace. Laminotomies were were then created removing the entire inferior margin of the lamina of L5 including the inferior facet at the L5-S1 joint. The yellow ligament was taken up and the common dural tube was exposed along with the L5 nerve root superiorly, and the S1 nerve root inferiorly, the disc space was exposed and epidural veins in this region were cauterized and divided. The L5 nerve roots and the S1 nerve root were dissected with care taken to protect them. On the right side at this point it was noted that there was a small dural incursion just under the lamina of L5. This necessitated a larger laminotomy and closure of the dural leak with 3 interrupted 60 proline sutures. In the end the leak was closed tightly to a Valsalva 45  cm of water. The disc space was opened and a combination of curettes and rongeurs was used to evacuate the disc space fully. The endplates were removed using sharp curettes. An interbody spacer was placed to distract the disc space while the contralateral discectomy was performed. When the entirety of the disc was removed and the endplates were prepared final sizing of the disc space was obtained 12 degree 8 mm tall 25 mm long peek spacers were chosen and packed with autograft and allograft and placed into the interspace. The remainder of the interspace was packed with autograft and allograft. Pedicle entry sites were then chosen using fluoroscopic guidance and 6.5 x 45 mm screws were placed in L5 and 6.5 x 45 mm screws were placed in S1. The lateral gutters were decorticated and graft was packed in the posterolateral gutters between L5 and S1. Final radiographs were obtained after placing appropriately sized rods between the pedicle screws at L5-S1 and torquing these to the appropriate tension. The surgical site was inspected carefully to assure the  L5 and S1 nerve roots were well decompressed, hemostasis was obtained, and the graft was well packed. Then the retractors were removed and the wound was closed with #1 Vicryl in the lumbar dorsal fascia 2-0 Vicryl in the subcutaneous tissue and 3-0 Vicryl subcuticularly. When he cc of half percent Marcaine was injected into the paraspinous musculature at the time of closure. Blood loss was estimated at 550 cc. The patient tolerated procedure well and was returned to the recovery room in stable condition.

## 2015-03-04 NOTE — Transfer of Care (Signed)
Immediate Anesthesia Transfer of Care Note  Patient: [name]  Procedure(s) Performed: Procedure(s) with comments: Decompression of Lumbar five- Sacral One disc spaceand nerve root  Posterior lumbar interbody fusion with autograft and allograft. pedicle screw fixation (N/A) - L5-S1 Posterior lumbar interbody fusion  Patient Location: PACU  Anesthesia Type:General  Level of Consciousness: awake, alert , oriented and sedated  Airway & Oxygen Therapy: Patient Spontanous Breathing and Patient connected to nasal cannula oxygen  Post-op Assessment: Report given to RN, Post -op Vital signs reviewed and stable and Patient moving all extremities  Post vital signs: Reviewed and stable  Last Vitals:  Filed Vitals:   03/04/15 1224  BP:   Pulse:   Temp: 37.3 C  Resp:     Complications: No apparent anesthesia complications

## 2015-03-04 NOTE — Care Management (Signed)
Utilization review completed. Dalani Mette, RN Case Manager [phone]. 

## 2015-03-04 NOTE — Progress Notes (Signed)
Orders not signed. Consent will need to be signed in [facility].

## 2015-03-04 NOTE — H&P (Signed)
CHIEF COMPLAINT:                                          Back pain, leg pain, and weakness.    HISTORY OF PRESENT ILLNESS:                     Ola is a 54 year old, right-handed individual who tells me that she has had problems with her back for a number of years.  Up until two years ago, she had been working, however, and she had been getting around reasonably well.  However, around October of last 2014 she started having increasing problems, and at that time she was evaluated by Dr. Earle Gell.  An MRI was performed which demonstrated that she had degenerative changes at L5-S1, some very mild changes at the upper lumber spine, but no surgical lesion was noted.  EMG and nerve conduction study was noted to be normal at that time.  He advised conservative management.  She was subsequently seen and evaluated last year at [facility] and had undergone a workup of the cervical spine.  She was found to have two areas in her neck that warranted surgical intervention, and this was done by Dr. Eliberto Ivory at that hospital.  However, she still woke up with having significant back pain and leg pain, albeit, she notes that her left leg felt a little stronger, she apparently had some weakness where she could not lift the left leg off the bed prior to her surgery.  She has had further workup of her lumbar spine that was undertaken at [facility], including discography of the four levels of her lumbar spine, all of which were reported to be non-concordant in reproducing her typical pain.  She has subsequently a series of facet blocks at L5-S1, L4-5, and L3-4, none of which were effective in even temporarily relieving her pain.  She is seen now for further evaluation of her condition to see if anything can be done to help her back and her legs.  I noted that as early as 2006 she had had an MRI of the cervical spine that showed spondylitic changes at C5-6 and C6-7 with some flattening of the spinal cord at  that level but no intrinsic cord changes.  The lumbar MRIs are summarized as noted in my evaluation.  I also noted the EMG and nerve conduction study from 2014 by Dr. Brien Few which showed normal conduction studies in the lower extremities.  She is now seen by Dr. Everette Rank, a neurologist who monitors her peripheral neuropathy.    PAST MEDICAL HISTORY:                                Reveals that she has hypertension.  She has also had a heart murmur.    . Prior Operations:  Previous surgeries include the cervical spine surgery in May of last year, bladder tack-up surgery on two occasions, and a hysterectomy in the past.  She has also had a cholecystectomy.    . Medications and Allergies:  Current medications include carbamazepine 200 mg two at night and two in the morning for nerve damage, oxybutynin, ibuprofen, alprazolam, lisinopril, hydrochlorothiazide, escitalopram, aspirin, and vitamin D3.  She uses a CPAP machine at night, and hydrocodone for pain control.  SOCIAL HISTORY:                                            Reveals that she does not smoke or use alcohol.    REVIEW OF SYSTEMS:                                    Notable for balance disturbance, difficulty starting and stopping the urinary stream, arm weakness, leg weakness, back pain, leg pain, joint pain and swelling, high blood pressure, irregular pulse, heart murmur, swelling in the feet and hands, and leg pain while walking.  She has difficulty with coordination in the arms and the legs, and she notes that she has had some depression.    PHYSICAL EXAMINATION:                                She notes that she has had some weight gain over the past couple of years and currently notes that her weight is around 250 pounds and 5 feet 3 inches in height.  She notes that she can attribute about a 20+ pound weight gain in the last two years, secondary to inactivity.    On physical examination, she stands straight and erect.  She walks with a  moderate, wide-based gait, and in looking at the individual muscle strength, she has good strength in the glutei bilaterally, but she has difficulty with the tibialis anterior, more so on the left than on the right, but she has difficulty walking onto the heels on either leg.  She is able to walk onto her toes and is able to balance briefly on both toes.  Her deep tendon reflexes are 3+ in the patellae and 3+ in both Achilles.  Sensation appears diminished to vibration in the lower extremities symmetrically, worse distally than proximally.    IMPRESSION/PLAN:                                                     Patient has had a longstanding history of back pain that has been evaluated multiplely.  More recently, she was evaluated for cervical spondylosis and underwent a two-level anterior decompression and arthrodesis.  She has had continued symptoms and has had substantial workup and a number of injections in the lumbar spine to no avail.  She continues to have substantial problems with lumbar back pain and lumbar radiculopathy.  This has been symmetric, though weakness has been worse on the left side.    A myelogram was recently performed demonstrates that she has adequate decompression of the cervical spine. The thoracic spine has 2 minor disc protrusion. Lumbar spine demonstrates that at L5-S1 there is advanced spondylosis slight degree of spondylolisthesis and bilateral foraminal stenosis for both the L5 and S1 nerve roots. After careful consideration of her options, I advised surgical decompression of L5-S1 this would repeat quire severe undercutting of the facet joints and he stabilization of the joint therefore advised that she should undergo posterior lumbar interbody arthrodesis using peek spacers local autograft and allograft and pedicle screw fixation at L5-S1. The  patient is now being admitted for this procedure.

## 2015-03-05 MED ORDER — METHOCARBAMOL 500 MG PO TABS: 500.0000 mg | ORAL_TABLET | Freq: Four times a day (QID) | ORAL | Status: DC | PRN

## 2015-03-05 MED ORDER — OXYCODONE-ACETAMINOPHEN 5-325 MG PO TABS: 1.0000 | ORAL_TABLET | ORAL | Status: DC | PRN

## 2015-03-05 MED FILL — Heparin Sodium (Porcine) Inj 1000 Unit/ML: INTRAMUSCULAR | Qty: 30 | Status: AC

## 2015-03-05 MED FILL — Sodium Chloride IV Soln 0.9%: INTRAVENOUS | Qty: 3000 | Status: AC

## 2015-03-05 NOTE — Discharge Instructions (Signed)

## 2015-03-05 NOTE — Clinical Social Work Note (Signed)
Clinical Social Worker received standing order referral for possible ST-SNF placement.  Chart reviewed.  PT/OT recommending no follow up at this time.  CSW signing off - please re consult if social work needs arise.  Barbette Or, [place]

## 2015-03-05 NOTE — Evaluation (Signed)
Physical Therapy Evaluation and discharge Patient Details Name: [name] MRN: [id number] DOB: 1960-12-07 Today's Date: 03/05/2015   History of Present Illness  54 y.o. s/p decompression of lumbar five-sacral One disc space and nerve root,posterior lumbar interbody fusion with autograft and allograft. pedicle screw fixation   Clinical Impression  Pt able to perform mobility while following back precautions with good recall from earlier OT session.  Pt demonstrated ability to manage stairs with S and ambulate on level surface with S to MOD I.  Pt has been ambulating hallway with family.  No further skilled PT needs identified and will d/c at this time.    Follow Up Recommendations No PT follow up    Equipment Recommendations  None recommended by PT    Recommendations for Other Services       Precautions / Restrictions Precautions Precautions: Back Precaution Booklet Issued:  (given yesterday) Precaution Comments: reviewed back precautions Required Braces or Orthoses: Spinal Brace Spinal Brace: Lumbar corset;Applied in sitting position Restrictions Weight Bearing Restrictions: No      Mobility  Bed Mobility Overal bed mobility: Needs Assistance Bed Mobility: Rolling;Sidelying to Sit;Sit to Sidelying Rolling: Modified independent (Device/Increase time) Sidelying to sit: Modified independent (Device/Increase time)     Sit to sidelying: Supervision General bed mobility comments: good recall of back precautions from earlier OT session  Transfers Overall transfer level: Needs assistance Equipment used: None Transfers: Sit to/from Stand Sit to Stand: Supervision            Ambulation/Gait Ambulation/Gait assistance: Supervision;Modified independent (Device/Increase time) Ambulation Distance (Feet): 400 Feet Assistive device: None Gait Pattern/deviations: Step-through pattern;Wide base of support     General Gait Details: Pt ambulated with brace on with no LOB  or difficulty  Stairs Stairs: Yes Stairs assistance: Supervision Stair Management: Two rails;Step to pattern Number of Stairs: 5 General stair comments: Pt able to manage stairs with S and step to pattern. No c/o increase in pain.  Wheelchair Mobility    Modified Rankin (Stroke Patients Only)       Balance Overall balance assessment: No apparent balance deficits (not formally assessed)                                           Pertinent Vitals/Pain Pain Assessment: 0-10 Pain Score: 3  Pain Location: back Pain Descriptors / Indicators: Sore Pain Intervention(s): Monitored during session    Home Living Family/patient expects to be discharged to:: Private residence Living Arrangements: Spouse/significant other Available Help at Discharge: Family;Available 24 hours/day Type of Home: Mobile home Home Access: Stairs to enter Entrance Stairs-Rails: Right;Left;Can reach both Entrance Stairs-Number of Steps: 5 Home Layout: One level Home Equipment: Walker - 2 wheels;Hand held shower head;Tub bench      Prior Function Level of Independence: Needs assistance      ADL's / Homemaking Assistance Needed: assist with LB bathing/dressing at times        Hand Dominance   Dominant Hand: Right    Extremity/Trunk Assessment   Upper Extremity Assessment: Defer to OT evaluation           Lower Extremity Assessment: Overall WFL for tasks assessed      Cervical / Trunk Assessment: Normal (brace intact)  Communication   Communication: No difficulties  Cognition Arousal/Alertness: Awake/alert Behavior During Therapy: WFL for tasks assessed/performed Overall Cognitive Status: Within Functional Limits for tasks assessed  General Comments General comments (skin integrity, edema, etc.): Pt reports she walked earlier today in the hallway.    Exercises        Assessment/Plan    PT Assessment Patent does not need any  further PT services  PT Diagnosis Difficulty walking   PT Problem List    PT Treatment Interventions     PT Goals (Current goals can be found in the Care Plan section) Acute Rehab PT Goals Patient Stated Goal: to go home today PT Goal Formulation: All assessment and education complete, DC therapy    Frequency     Barriers to discharge        Co-evaluation               End of Session Equipment Utilized During Treatment: Back brace Activity Tolerance: Patient tolerated treatment well Patient left: in chair;with call bell/phone within reach;with family/visitor present           Time: 1100-1113 PT Time Calculation (min) (ACUTE ONLY): 13 min   Charges:   PT Evaluation $Initial PT Evaluation Tier I: 1 Procedure     PT G Codes:        [name] 03/05/2015, 12:31 PM

## 2015-03-05 NOTE — Progress Notes (Signed)
Pt doing well. Pt and husband given D/C instructions with Rx's, verbal understanding was provided. Pt's incision is clean and dry with no sign of infection. Pt's IV was removed prior to D/C. Pt D/C'd home via wheelchair @ 1355 per MD order. Pt is stable @ D/C and has no other needs at this time. Holli Humbles, RN

## 2015-03-05 NOTE — Progress Notes (Signed)
Occupational Therapy Treatment Patient Details Name: [name] MRN: [id number] DOB: [birth year] Today's Date: 03/05/2015    History of present illness 54 y.o. s/p decompression of lumbar five-sacral One disc space and nerve root,posterior lumbar interbody fusion with autograft and allograft. pedicle screw fixation    OT comments  Pt progressing. Plan to see for one more additional session to reinforce precautions.  Follow Up Recommendations  No OT follow up    Equipment Recommendations   (AE)    Recommendations for Other Services      Precautions / Restrictions Precautions Precautions: Back Precaution Booklet Issued:  (given yesterday) Precaution Comments: reviewed back precautions Required Braces or Orthoses: Spinal Brace Spinal Brace: Lumbar corset;Applied in sitting position Restrictions Weight Bearing Restrictions: No       Mobility Bed Mobility Overal bed mobility: Needs Assistance Bed Mobility: Rolling;Sidelying to Sit;Sit to Sidelying Rolling: Supervision Sidelying to sit: Supervision     Sit to sidelying: Supervision General bed mobility comments: cues for technique.  Transfers Overall transfer level: Needs assistance   Transfers: Sit to/from Stand Sit to Stand: Supervision              Balance      No LOB in session.                             ADL Overall ADL's : Needs assistance/impaired   Eating/Feeding Details (indicate cue type and reason): pt appeared to be twisting placing coffee cup on table             Upper Body Dressing : Minimal assistance;Sitting;Standing   Lower Body Dressing: Set up;Supervision/safety;Sit to/from stand;With adaptive equipment Lower Body Dressing Details (indicate cue type and reason): cues for precautions Toilet Transfer: Supervision/safety;Ambulation (sit to stand from bed)           Functional mobility during ADLs: Supervision/safety General ADL Comments: Educated on AE. Pt  practiced with reacher and sockaid. Educated on safety such as safe footwear. Reviewed tub transfer technique. Discussed back brace. Discussed incorporating precautions into functional activities. Cues for precautions in session.      Vision                     Perception     Praxis      Cognition  Awake/Alert Behavior During Therapy: WFL for tasks assessed/performed Overall Cognitive Status: Within Functional Limits for tasks assessed                       Extremity/Trunk Assessment               Exercises     Shoulder Instructions       General Comments      Pertinent Vitals/ Pain       Pain Assessment: 0-10 Pain Score: 3  Pain Location: back Pain Descriptors / Indicators: Sore Pain Intervention(s): Monitored during session  Home Living                                          Prior Functioning/Environment              Frequency Min 2X/week     Progress Toward Goals  OT Goals(current goals can now be found in the care plan section)  Progress towards OT goals: Progressing toward goals  Acute Rehab OT Goals Patient Stated Goal: not stated OT Goal Formulation: With patient Time For Goal Achievement: 03/11/15 Potential to Achieve Goals: Good ADL Goals Pt Will Perform Lower Body Dressing: with set-up;sit to/from stand Pt Will Transfer to Toilet: with supervision;ambulating (3 in 1 over commode) Pt Will Perform Toileting - Clothing Manipulation and hygiene: sit to/from stand;with supervision;with set-up Additional ADL Goal #1: Pt will independently verbalize 3/3 back precautions and maintain during session.  Plan Discharge plan remains appropriate    Co-evaluation                 End of Session Equipment Utilized During Treatment: Back brace   Activity Tolerance Patient tolerated treatment well   Patient Left in bed;with call bell/phone within reach   Nurse Communication Other (comment) (pt breaking  precautions)        Time: 8719-5974 OT Time Calculation (min): 17 min  Charges: OT General Charges $OT Visit: 1 Procedure OT Treatments $Self Care/Home Management : 8-22 mins  Benito Mccreedy OTR/L [phone] 03/05/2015, 9:58 AM

## 2015-03-05 NOTE — Discharge Summary (Signed)
Physician Discharge Summary  Patient ID: [name] MRN: [id number] DOB/AGE: Sep 14, 1960 54 y.o.  Admit date: 03/04/2015 Discharge date: 03/05/2015  Admission Diagnoses: L5-S1 spinal stenosis with radiculopathy and neurogenic claudication  Discharge Diagnoses: L5-S1 spinal stenosis with radiculopathy and neurogenic claudication  Active Problems:   Spinal stenosis, lumbar region, with neurogenic claudication   Discharged Condition: good  Hospital Course: Patient underwent surgical decompression and stabilization at L5-S1. She tolerated surgery well. She is discharged home.  Consults: None  Significant Diagnostic Studies: None  Treatments: surgery: Decompression L5-S1 posterior lumbar interbody arthrodesis with peek spacers local autograft and allograft. Pedicle screw fixation L5-S1.  Discharge Exam: Blood pressure 99/49, pulse 81, temperature 98.6 F (37 C), temperature source Oral, resp. rate 18, height 5\' 3"  (1.6 m), weight 113.172 kg (249 lb 8 oz), SpO2 93 %. Incision is clean and dry, motor function is intact in lower extremities.  Disposition: 01-Home or Self Care  Discharge Instructions    Call MD for:  redness, tenderness, or signs of infection (pain, swelling, redness, odor or green/yellow discharge around incision site)    Complete by:  As directed      Call MD for:  severe uncontrolled pain    Complete by:  As directed      Call MD for:  temperature >100.4    Complete by:  As directed      Diet - low sodium heart healthy    Complete by:  As directed      Discharge instructions    Complete by:  As directed   Okay to shower. Do not apply salves or appointments to incision. No heavy lifting with the upper extremities greater than 15 pounds. May resume driving when not requiring pain medication and patient feels comfortable with doing so.     Increase activity slowly    Complete by:  As directed             Medication List    TAKE these medications        ALPRAZolam 0.5 MG tablet  Commonly known as:  XANAX  Take 0.5 mg by mouth at bedtime.     aspirin 81 MG tablet  Take 81 mg by mouth daily.     cholecalciferol 1000 UNITS tablet  Commonly known as:  VITAMIN D  Take 1,000 Units by mouth daily.     escitalopram 10 MG tablet  Commonly known as:  LEXAPRO  Take 20 mg by mouth daily.     fish oil-omega-3 fatty acids 1000 MG capsule  Take 2 g by mouth daily.     hydrochlorothiazide 25 MG tablet  Commonly known as:  HYDRODIURIL  Take 25 mg by mouth daily.     ibuprofen 800 MG tablet  Commonly known as:  ADVIL,MOTRIN  Take 800 mg by mouth 3 (three) times daily as needed for moderate pain.     lisinopril 10 MG tablet  Commonly known as:  PRINIVIL,ZESTRIL  Take 10 mg by mouth daily.     methocarbamol 500 MG tablet  Commonly known as:  ROBAXIN  Take 1 tablet (500 mg total) by mouth every 6 (six) hours as needed for muscle spasms.     oxybutynin 5 MG tablet  Commonly known as:  DITROPAN  Take 10 mg by mouth at bedtime.     oxyCODONE-acetaminophen 5-325 MG per tablet  Commonly known as:  PERCOCET/ROXICET  Take 1-2 tablets by mouth every 4 (four) hours as needed for moderate pain.  SignedEarleen Newport 03/05/2015, 12:49 PM

## 2015-03-05 NOTE — Progress Notes (Addendum)
Patient ID: [name], female   DOB: 04-20-1961, 55 y.o.   MRN: [id number] Vital signs are stable Foley just remove this morning Motor function appears to be intact Dressing is dry Patient is reasonably comfortable Plan discharge

## 2015-03-26 ENCOUNTER — Other Ambulatory Visit: Payer: Self-pay

## 2015-03-26 DIAGNOSIS — Z1231 Encounter for screening mammogram for malignant neoplasm of breast: Secondary | ICD-10-CM

## 2015-05-06 ENCOUNTER — Ambulatory Visit
Admission: RE | Admit: 2015-05-06 | Discharge: 2015-05-06 | Disposition: A | Discharge status: Home / Self Care | Payer: 59 | Source: Ambulatory Visit

## 2015-05-06 DIAGNOSIS — Z1231 Encounter for screening mammogram for malignant neoplasm of breast: Secondary | ICD-10-CM

## 2015-05-14 ENCOUNTER — Ambulatory Visit
Admission: RE | Admit: 2015-05-14 | Discharge: 2015-05-14 | Disposition: A | Discharge status: Home / Self Care | Payer: 59 | Source: Ambulatory Visit | Attending: Family Medicine | Admitting: Family Medicine

## 2015-05-14 ENCOUNTER — Other Ambulatory Visit: Payer: Self-pay | Admitting: Family Medicine

## 2015-05-14 DIAGNOSIS — N6320 Unspecified lump in the left breast, unspecified quadrant: Secondary | ICD-10-CM

## 2015-05-21 ENCOUNTER — Ambulatory Visit
Admission: RE | Admit: 2015-05-21 | Discharge: 2015-05-21 | Disposition: A | Discharge status: Home / Self Care | Payer: 59 | Source: Ambulatory Visit | Attending: Family Medicine | Admitting: Family Medicine

## 2015-05-21 ENCOUNTER — Other Ambulatory Visit: Payer: Self-pay | Admitting: Family Medicine

## 2015-05-21 DIAGNOSIS — N6320 Unspecified lump in the left breast, unspecified quadrant: Secondary | ICD-10-CM

## 2015-05-22 ENCOUNTER — Other Ambulatory Visit: Payer: Self-pay | Admitting: Family Medicine

## 2015-05-22 DIAGNOSIS — N6320 Unspecified lump in the left breast, unspecified quadrant: Secondary | ICD-10-CM

## 2015-05-28 ENCOUNTER — Ambulatory Visit
Admission: RE | Admit: 2015-05-28 | Discharge: 2015-05-28 | Disposition: A | Discharge status: Home / Self Care | Payer: 59 | Source: Ambulatory Visit | Attending: Family Medicine | Admitting: Family Medicine

## 2015-05-28 DIAGNOSIS — N6320 Unspecified lump in the left breast, unspecified quadrant: Secondary | ICD-10-CM

## 2015-05-29 ENCOUNTER — Other Ambulatory Visit: Payer: Self-pay | Admitting: Family Medicine

## 2015-05-29 DIAGNOSIS — R9389 Abnormal findings on diagnostic imaging of other specified body structures: Secondary | ICD-10-CM

## 2015-05-29 DIAGNOSIS — R911 Solitary pulmonary nodule: Secondary | ICD-10-CM

## 2015-07-05 ENCOUNTER — Other Ambulatory Visit: Payer: Self-pay | Admitting: Neurological Surgery

## 2015-07-05 DIAGNOSIS — M4800 Spinal stenosis, site unspecified: Secondary | ICD-10-CM

## 2015-07-09 ENCOUNTER — Ambulatory Visit
Admission: RE | Admit: 2015-07-09 | Discharge: 2015-07-09 | Disposition: A | Discharge status: Home / Self Care | Payer: 59 | Source: Ambulatory Visit | Attending: Family Medicine | Admitting: Family Medicine

## 2015-07-09 ENCOUNTER — Ambulatory Visit
Admission: RE | Admit: 2015-07-09 | Discharge: 2015-07-09 | Disposition: A | Discharge status: Home / Self Care | Payer: 59 | Source: Ambulatory Visit | Attending: Neurological Surgery | Admitting: Neurological Surgery

## 2015-07-09 ENCOUNTER — Inpatient Hospital Stay: Admission: RE | Admit: 2015-07-09 | Payer: 59 | Source: Ambulatory Visit

## 2015-07-09 DIAGNOSIS — R911 Solitary pulmonary nodule: Secondary | ICD-10-CM

## 2015-07-09 DIAGNOSIS — R9389 Abnormal findings on diagnostic imaging of other specified body structures: Secondary | ICD-10-CM

## 2015-07-09 DIAGNOSIS — M4800 Spinal stenosis, site unspecified: Secondary | ICD-10-CM

## 2015-07-09 MED ORDER — IOPAMIDOL (ISOVUE-300) INJECTION 61%
75.0000 mL | Freq: Once | INTRAVENOUS | Status: AC | PRN
  Administered 2015-07-09: 75 mL via INTRAVENOUS

## 2015-12-08 ENCOUNTER — Emergency Department (HOSPITAL_COMMUNITY): Payer: BLUE CROSS/BLUE SHIELD

## 2015-12-08 ENCOUNTER — Encounter (HOSPITAL_COMMUNITY): Payer: Self-pay | Admitting: Emergency Medicine

## 2015-12-08 ENCOUNTER — Emergency Department (HOSPITAL_COMMUNITY)
Admission: EM | Admit: 2015-12-08 | Discharge: 2015-12-08 | Disposition: A | Discharge status: Home / Self Care | Payer: BLUE CROSS/BLUE SHIELD | Attending: Emergency Medicine | Admitting: Emergency Medicine

## 2015-12-08 DIAGNOSIS — F329 Major depressive disorder, single episode, unspecified: Secondary | ICD-10-CM | POA: Diagnosis not present

## 2015-12-08 DIAGNOSIS — G8929 Other chronic pain: Secondary | ICD-10-CM | POA: Diagnosis not present

## 2015-12-08 DIAGNOSIS — Z9981 Dependence on supplemental oxygen: Secondary | ICD-10-CM | POA: Insufficient documentation

## 2015-12-08 DIAGNOSIS — Z79899 Other long term (current) drug therapy: Secondary | ICD-10-CM | POA: Insufficient documentation

## 2015-12-08 DIAGNOSIS — Z8709 Personal history of other diseases of the respiratory system: Secondary | ICD-10-CM | POA: Insufficient documentation

## 2015-12-08 DIAGNOSIS — Z7982 Long term (current) use of aspirin: Secondary | ICD-10-CM | POA: Insufficient documentation

## 2015-12-08 DIAGNOSIS — I10 Essential (primary) hypertension: Secondary | ICD-10-CM | POA: Insufficient documentation

## 2015-12-08 DIAGNOSIS — G4700 Insomnia, unspecified: Secondary | ICD-10-CM | POA: Insufficient documentation

## 2015-12-08 DIAGNOSIS — R011 Cardiac murmur, unspecified: Secondary | ICD-10-CM | POA: Insufficient documentation

## 2015-12-08 DIAGNOSIS — Z86010 Personal history of colonic polyps: Secondary | ICD-10-CM | POA: Insufficient documentation

## 2015-12-08 DIAGNOSIS — G4733 Obstructive sleep apnea (adult) (pediatric): Secondary | ICD-10-CM | POA: Insufficient documentation

## 2015-12-08 DIAGNOSIS — K625 Hemorrhage of anus and rectum: Secondary | ICD-10-CM

## 2015-12-08 DIAGNOSIS — R195 Other fecal abnormalities: Secondary | ICD-10-CM | POA: Diagnosis not present

## 2015-12-08 DIAGNOSIS — M1990 Unspecified osteoarthritis, unspecified site: Secondary | ICD-10-CM | POA: Insufficient documentation

## 2015-12-08 DIAGNOSIS — R110 Nausea: Secondary | ICD-10-CM | POA: Insufficient documentation

## 2015-12-08 LAB — PROTIME-INR
INR: 1.13 (ref 0.00–1.49)
PROTHROMBIN TIME: 14.7 s (ref 11.6–15.2)

## 2015-12-08 LAB — COMPREHENSIVE METABOLIC PANEL
ALK PHOS: 94 U/L (ref 38–126)
ALT: 51 U/L (ref 14–54)
AST: 37 U/L (ref 15–41)
Albumin: 3.8 g/dL (ref 3.5–5.0)
Anion gap: 9 (ref 5–15)
BUN: 30 mg/dL — ABNORMAL HIGH (ref 6–20)
CALCIUM: 9.5 mg/dL (ref 8.9–10.3)
CO2: 24 mmol/L (ref 22–32)
CREATININE: 1.27 mg/dL — AB (ref 0.44–1.00)
Chloride: 102 mmol/L (ref 101–111)
GFR, EST AFRICAN AMERICAN: 54 mL/min — AB (ref >60)
GFR, EST NON AFRICAN AMERICAN: 47 mL/min — AB (ref >60)
Glucose, Bld: 156 mg/dL — ABNORMAL HIGH (ref 65–99)
Potassium: 3.8 mmol/L (ref 3.5–5.1)
SODIUM: 135 mmol/L (ref 135–145)
Total Bilirubin: 0.5 mg/dL (ref 0.3–1.2)
Total Protein: 7.2 g/dL (ref 6.5–8.1)

## 2015-12-08 LAB — CBC WITH DIFFERENTIAL/PLATELET
BASOS PCT: 0 %
Basophils Absolute: 0 10*3/uL (ref 0.0–0.1)
EOS ABS: 0.1 10*3/uL (ref 0.0–0.7)
Eosinophils Relative: 1 %
HCT: 36.3 % (ref 36.0–46.0)
HEMOGLOBIN: 12.1 g/dL (ref 12.0–15.0)
Lymphocytes Relative: 28 %
Lymphs Abs: 2 10*3/uL (ref 0.7–4.0)
MCH: 31.8 pg (ref 26.0–34.0)
MCHC: 33.3 g/dL (ref 30.0–36.0)
MCV: 95.3 fL (ref 78.0–100.0)
MONOS PCT: 10 %
Monocytes Absolute: 0.7 10*3/uL (ref 0.1–1.0)
NEUTROS PCT: 61 %
Neutro Abs: 4.3 10*3/uL (ref 1.7–7.7)
PLATELETS: 218 10*3/uL (ref 150–400)
RBC: 3.81 MIL/uL — ABNORMAL LOW (ref 3.87–5.11)
RDW: 13 % (ref 11.5–15.5)
WBC: 7.1 10*3/uL (ref 4.0–10.5)

## 2015-12-08 LAB — POC OCCULT BLOOD, ED: Fecal Occult Bld: POSITIVE — AB

## 2015-12-08 MED ORDER — IOPAMIDOL (ISOVUE-300) INJECTION 61%
INTRAVENOUS | Status: AC
  Administered 2015-12-08: 100 mL
  Filled 2015-12-08: qty 100

## 2015-12-08 MED ORDER — ONDANSETRON HCL 4 MG PO TABS: 4.0000 mg | ORAL_TABLET | Freq: Three times a day (TID) | ORAL | Status: DC | PRN

## 2015-12-08 MED ORDER — ONDANSETRON HCL 4 MG/2ML IJ SOLN
4.0000 mg | Freq: Once | INTRAMUSCULAR | Status: AC
  Administered 2015-12-08: 4 mg via INTRAVENOUS
  Filled 2015-12-08: qty 2

## 2015-12-08 MED ORDER — SODIUM CHLORIDE 0.9 % IV BOLUS (SEPSIS)
1000.0000 mL | Freq: Once | INTRAVENOUS | Status: AC
  Administered 2015-12-08: 1000 mL via INTRAVENOUS

## 2015-12-08 NOTE — ED Notes (Signed)
Bright red blood from rectum this morning; thought she had passed gas and when she stood up there was blood on the couch. Also bright red blood with stool x 1 today. Yesterday had abdominal pain and nausea with diarrhea x3. Feeling fatigued.

## 2015-12-08 NOTE — ED Provider Notes (Signed)
CSN: [id number]     Arrival date & time 12/08/15  0957 History   First MD Initiated Contact with Patient 12/08/15 1009     Chief Complaint  Patient presents with  . Rectal Bleeding     (Consider location/radiation/quality/duration/timing/severity/associated sxs/prior Treatment) Patient is a 55 y.o. female presenting with general illness.  Illness Location:  BRBPR Severity:  Moderate Onset quality:  Sudden Duration:  1 day Timing:  Intermittent Progression:  Worsening Chronicity:  New Context:  History of constipation. 1 day of fatigue, feeling nauseated. No emesis. Yesterday had slight red tinge to stool. Today noticed a large amount of bright red blood per rectum. No black discoloration. Pain in her left lower abdomen. No fevers and chills. No localizing infectious symptoms. Associated symptoms: abdominal pain, diarrhea, fatigue and nausea   Associated symptoms: no chest pain, no cough, no fever, no headaches, no shortness of breath and no vomiting     Past Medical History  Diagnosis Date  . Aortic valve calcification     thickened  . OSA on CPAP   . Depression     takes Lexapro daily  . Hypertension     takes Lisinopril and HCTZ daily  . Urinary frequency     takes Ditropan nightly  . Insomnia     takes Xanax at bedtime  . PONV (postoperative nausea and vomiting)   . Murmur, cardiac   . History of bronchitis 2 yrs ago  . Headache   . Vertigo     hx of that required Meclizine   . Weakness     numbness and tingling in both legs and occasionally in the left  . Arthritis   . Chronic back pain     stenosis  . History of colon polyps     benign  . Nocturia   . Urinary urgency    Past Surgical History  Procedure Laterality Date  . Bladder tack      x 2   . Cholecystectomy    . Abdominal hysterectomy  1998  . Cervical fusion    . Colonoscopy     Family History  Problem Relation Age of Onset  . Hypertension Father   . Cerebrovascular Disease Father   .  Bladder Cancer Father   . Colon cancer Mother   . Hypertension Mother   . Hyperlipidemia Mother   . Kidney cancer Brother   . Hypertension Brother   . Diabetes Brother   . Diabetes Brother   . Cerebrovascular Disease Brother   . Sleep apnea      siblings  . Hypertension Sister   . Diabetes Sister    Social History  Substance Use Topics  . Smoking status: Never Smoker   . Smokeless tobacco: Never Used     Comment: quit smoking 20+yrs ago  . Alcohol Use: No   OB History    No data available     Review of Systems  Constitutional: Positive for fatigue. Negative for fever and chills.  Respiratory: Negative for cough and shortness of breath.   Cardiovascular: Negative for chest pain.  Gastrointestinal: Positive for nausea, abdominal pain, diarrhea, blood in stool and anal bleeding. Negative for vomiting, abdominal distention and rectal pain.  Genitourinary: Negative for dysuria, vaginal bleeding and vaginal discharge.  Musculoskeletal: Negative for back pain.  Neurological: Negative for light-headedness and headaches.  All other systems reviewed and are negative.     Allergies  Review of patient's allergies indicates no known allergies.  Home Medications  Prior to Admission medications   Medication Sig Start Date End Date Taking? Authorizing Provider  ALPRAZolam Duanne Moron) 0.5 MG tablet Take 0.5 mg by mouth at bedtime.   Yes Historical Provider, MD  amitriptyline (ELAVIL) 25 MG tablet Take 25 mg by mouth at bedtime.   Yes Historical Provider, MD  aspirin 81 MG tablet Take 81 mg by mouth daily.   Yes Historical Provider, MD  carbamazepine (TEGRETOL) 200 MG tablet Take 400 mg by mouth 2 (two) times daily.   Yes Historical Provider, MD  cholecalciferol (VITAMIN D) 1000 UNITS tablet Take 1,000 Units by mouth daily.   Yes Historical Provider, MD  escitalopram (LEXAPRO) 10 MG tablet Take 20 mg by mouth daily.   Yes Historical Provider, MD  fish oil-omega-3 fatty acids 1000 MG  capsule Take 2 g by mouth daily.   Yes Historical Provider, MD  hydrochlorothiazide (HYDRODIURIL) 25 MG tablet Take 25 mg by mouth daily.   Yes Historical Provider, MD  ibuprofen (ADVIL,MOTRIN) 800 MG tablet Take 800 mg by mouth 3 (three) times daily as needed for moderate pain.   Yes Historical Provider, MD  lisinopril (PRINIVIL,ZESTRIL) 10 MG tablet Take 10 mg by mouth daily.   Yes Historical Provider, MD  oxybutynin (DITROPAN) 5 MG tablet Take 10 mg by mouth at bedtime.   Yes Historical Provider, MD  phentermine 37.5 MG capsule Take 37.5 mg by mouth daily.   Yes Historical Provider, MD  traMADol (ULTRAM) 50 MG tablet Take 50 mg by mouth every 12 (twelve) hours as needed for moderate pain.   Yes Historical Provider, MD  carbamazepine (TEGRETOL XR) 400 MG 12 hr tablet Take 400 mg by mouth 2 (two) times daily.    Historical Provider, MD  methocarbamol (ROBAXIN) 500 MG tablet Take 1 tablet (500 mg total) by mouth every 6 (six) hours as needed for muscle spasms. Patient not taking: Reported on 12/08/2015 03/05/15   Kristeen Miss, MD  ondansetron (ZOFRAN) 4 MG tablet Take 1 tablet (4 mg total) by mouth every 8 (eight) hours as needed for nausea or vomiting. 12/08/15   Maryan Puls, MD  oxyCODONE-acetaminophen (PERCOCET/ROXICET) 5-325 MG per tablet Take 1-2 tablets by mouth every 4 (four) hours as needed for moderate pain. Patient not taking: Reported on 12/08/2015 03/05/15   Kristeen Miss, MD   BP 107/40 mmHg  Pulse 81  Temp(Src) 97.8 F (36.6 C) (Oral)  Resp 15  SpO2 98% Physical Exam  Constitutional: She is oriented to person, place, and time. She appears well-developed and well-nourished. No distress.  HENT:  Head: Normocephalic and atraumatic.  Mouth/Throat: Mucous membranes are dry.  Eyes: EOM are normal. Pupils are equal, round, and reactive to light.  Neck: Normal range of motion.  Cardiovascular: Normal rate and regular rhythm.   Pulmonary/Chest: No tachypnea. No respiratory distress.   Abdominal: Soft. Normal appearance. There is tenderness in the left lower quadrant. There is no rebound, no guarding and no CVA tenderness.  Genitourinary: Rectal exam shows no external hemorrhoid and no fissure. Guaiac positive stool.  No gross blood. No internal abscess palpated.   Neurological: She is alert and oriented to person, place, and time. GCS eye subscore is 4. GCS verbal subscore is 5. GCS motor subscore is 6.  Skin: Skin is warm and dry.    ED Course  Procedures (including critical care time) Labs Review Labs Reviewed  COMPREHENSIVE METABOLIC PANEL - Abnormal; Notable for the following:    Glucose, Bld 156 (*)    BUN 30 (*)  Creatinine, Ser 1.27 (*)    GFR calc non Af Amer 47 (*)    GFR calc Af Amer 54 (*)    All other components within normal limits  CBC WITH DIFFERENTIAL/PLATELET - Abnormal; Notable for the following:    RBC 3.81 (*)    All other components within normal limits  POC OCCULT BLOOD, ED - Abnormal; Notable for the following:    Fecal Occult Bld POSITIVE (*)    All other components within normal limits  PROTIME-INR    Imaging Review Ct Abdomen Pelvis W Contrast  12/08/2015  CLINICAL DATA:  Left mid abdominal pain for the past 2 days. The patient has also had diffuse abdominal pain and bright red blood in the stools. EXAM: CT ABDOMEN AND PELVIS WITH CONTRAST TECHNIQUE: Multidetector CT imaging of the abdomen and pelvis was performed using the standard protocol following bolus administration of intravenous contrast. CONTRAST:  1 ISOVUE-300 IOPAMIDOL (ISOVUE-300) INJECTION 61% COMPARISON:  None. FINDINGS: Lower chest:  The mild left basilar linear atelectasis or scarring. Hepatobiliary: Mild diffuse low density of the liver relative to the spleen. 1 cm rounded a vague low density in the anterior segment of the right lobe of the liver on image number 34 of series 2 and image number 33 of series 7. Cholecystectomy clips. Pancreas: No mass, inflammatory  changes, or other significant abnormality. Spleen: Within normal limits in size and appearance. Adrenals/Urinary Tract: Normal appearing adrenal glands, kidneys, ureters and urinary bladder. No urinary tract calculi or hydronephrosis. Stomach/Bowel: No gastrointestinal abnormalities. Normal appearing appendix. Vascular/Lymphatic: Minimal atheromatous arterial calcifications. No aneurysm or enlarged lymph nodes. Reproductive: Surgically absent uterus.  Normal appearing ovaries. Other: Small umbilical hernia containing fat. Musculoskeletal: Lower thoracic spine degenerative changes and mild lumbar spine degenerative changes. Interbody bone plug and pedicle screw and rod fixation at the L5-S1 level with normal alignment. IMPRESSION: 1. No acute abnormality. 2. Mild diffuse hepatic steatosis. 3. 1 cm right lobe liver probable cyst. A small hemangioma or other mass are less likely. Electronically Signed   By: Claudie Revering M.D.   On: 12/08/2015 12:21   I have personally reviewed and evaluated these images and lab results as part of my medical decision-making.   EKG Interpretation None      MDM   Final diagnoses:  BRBPR (bright red blood per rectum)    55 year old female presenting with bright red blood per rectum. Feel most likely diagnosis for the patient is diverticulosis. Patient does have a history of constipation. Had tenderness to palpation in her left lower quadrant. No peritoneal signs. Doubt acute abdomen. Has had some mild nausea but no other systemic symptoms. Gave her some IV fluids and antiemetics with improvement in symptoms. We'll give her a prescription for Zofran. CT of her abdomen and pelvis didn't show any evidence of diverticulitis. She is still having bowel movements, and does not have any CT evidence of bowel obstruction. No anemia. Vital signs here are normal and stable. No evidence of hypotension or tachycardia. Creatinine is slightly elevated from baseline, likely some degree of  dehydration. Gave her some IV fluids. Encouraged her to push by mouth fluids. Fecal occult blood test was positive here. No gross blood on physical exam DRE. No evidence of perirectal abscess or internal abscess. She denies any melena and has no history of potential source for upper GI bleed.  Patient does have a family history of colon cancer. Has had multiple colonoscopies in the past and has a gastroenterologist that she sees  regularly. Strongly encouraged her to have a follow-up appointment with gastroenterology for repeat colonoscopy as soon as possible.  Strict return precautions provided. Encouraged her to follow up with her primary care doctor for a creatinine rechecked, as well as gastroenterology as soon as possible.  Patient discharged in stable condition.  Maryan Puls, MD 12/08/15 [id number]  Blanchie Dessert, MD 12/10/15 1455

## 2015-12-08 NOTE — Discharge Instructions (Signed)
See your gastroenterologist as soon as possible for a repeat colonoscopy.  See your primary care doctor in the next several days for repeat blood level check.  Return to the vertigo department should he have worsening of bleeding, lightheadedness, severe fatigue, shortness of breath, chest pain.

## 2016-04-23 ENCOUNTER — Telehealth: Payer: Self-pay | Admitting: Cardiology

## 2016-04-23 NOTE — Telephone Encounter (Signed)
Received records from [facility] for appointment on 05/20/16 with Dr Stanford Breed in [place].  Records given to [facility] (medical records) for Dr Jacalyn Lefevre schedule on 05/20/16. lp

## 2016-04-29 ENCOUNTER — Other Ambulatory Visit: Payer: Self-pay | Admitting: Family Medicine

## 2016-04-29 DIAGNOSIS — Z1231 Encounter for screening mammogram for malignant neoplasm of breast: Secondary | ICD-10-CM

## 2016-05-18 NOTE — Progress Notes (Signed)
HPI: 55 year old female for evaluation of aortic insufficiency. Patient apparently with history of mild aortic insufficiency but no echo since 2012. Previous echo is not available. Patient does have some dyspnea on exertion. Occasional orthopnea but no PND. Minimal pedal edema. She occasionally has pain in her left chest area lasting several minutes. It is a sharp pain with some numbness in her left upper extremity. Not exertional or related to food. No associated symptoms. She does not have exertional chest pain. No syncope.  Current Outpatient Prescriptions  Medication Sig Dispense Refill  . acetaminophen (TYLENOL) 500 MG tablet Take 500 mg by mouth every 6 (six) hours as needed.    . ALPRAZolam (XANAX) 0.5 MG tablet Take 0.5 mg by mouth at bedtime.    Marland Kitchen amitriptyline (ELAVIL) 25 MG tablet Take 25 mg by mouth at bedtime.    Marland Kitchen aspirin 81 MG tablet Take 81 mg by mouth daily.    Marland Kitchen atorvastatin (LIPITOR) 10 MG tablet Take 10 mg by mouth daily.    . carbamazepine (TEGRETOL) 200 MG tablet Take 400 mg by mouth 3 (three) times daily.     . cholecalciferol (VITAMIN D) 1000 UNITS tablet Take 1,000 Units by mouth daily.    . clindamycin (CLEOCIN) 300 MG capsule Take 300 mg by mouth 3 (three) times daily.    Marland Kitchen escitalopram (LEXAPRO) 10 MG tablet Take 20 mg by mouth daily.    . fish oil-omega-3 fatty acids 1000 MG capsule Take 2 g by mouth daily.    Marland Kitchen lisinopril-hydrochlorothiazide (PRINZIDE,ZESTORETIC) 10-12.5 MG tablet Take 1 tablet by mouth daily.    . metFORMIN (GLUCOPHAGE) 500 MG tablet Take 500 mg by mouth as directed. 500 MG in the morning and 1500 mg in the evening    . oxyCODONE-acetaminophen (PERCOCET/ROXICET) 5-325 MG per tablet Take 1-2 tablets by mouth every 4 (four) hours as needed for moderate pain. 30 tablet 0  . tolterodine (DETROL LA) 4 MG 24 hr capsule Take 4 mg by mouth daily.     No current facility-administered medications for this visit.     No Known Allergies   Past  Medical History:  Diagnosis Date  . Aortic valve calcification    thickened  . Arthritis   . Chronic back pain    stenosis  . Depression    takes Lexapro daily  . Diabetes mellitus ([place])   . Headache   . History of bronchitis 2 yrs ago  . History of colon polyps    benign  . Hyperlipidemia   . Hypertension    takes Lisinopril and HCTZ daily  . Insomnia    takes Xanax at bedtime  . Murmur, cardiac   . Nocturia   . OSA on CPAP   . PONV (postoperative nausea and vomiting)   . Urinary frequency    takes Ditropan nightly  . Urinary urgency   . Vertigo    hx of that required Meclizine   . Weakness    numbness and tingling in both legs and occasionally in the left    Past Surgical History:  Procedure Laterality Date  . ABDOMINAL HYSTERECTOMY  1998  . BACK SURGERY    . bladder tack     x 2   . CERVICAL FUSION    . CHOLECYSTECTOMY    . COLONOSCOPY      Social History   Social History  . Marital status: Married    Spouse name: N/A  . Number of children: 2  .  Years of education: N/A   Occupational History  . Not on file.   Social History Main Topics  . Smoking status: Former Research scientist (life sciences)  . Smokeless tobacco: Never Used     Comment: quit smoking 20+yrs ago  . Alcohol use No  . Drug use: No  . Sexual activity: Yes    Birth control/ protection: Surgical   Other Topics Concern  . Not on file   Social History Narrative   History of smoking cigarettes: former smoker, quit in year quit 20 years ago. No smoking. No alcohol. No caffeine, occasionally. No recreational drug use. Diet: drinks water. Watches salts and fats: eats fruits and veggies. Exercise: yes, tries to walk once a week. Occupation: works in a bindery. Education: high school. Marital status: married. Children: 2 girls. Seat belt use: yes.    Family History  Problem Relation Age of Onset  . Hypertension Father   . Cerebrovascular Disease Father   . Bladder Cancer Father   . Colon cancer Mother   .  Hypertension Mother   . Hyperlipidemia Mother   . Kidney cancer Brother   . Hypertension Brother   . Diabetes Brother   . Diabetes Brother   . Cerebrovascular Disease Brother   . Hypertension Sister   . Diabetes Sister   . Sleep apnea      siblings    ROS: Problems with back pain but no fevers or chills, productive cough, hemoptysis, dysphasia, odynophagia, melena, hematochezia, dysuria, hematuria, rash, seizure activity, orthopnea, PND, claudication. Remaining systems are negative.  Physical Exam:   Blood pressure 140/82, pulse 79, height 5\' 3"  (1.6 m), weight 255 lb 1.9 oz (115.7 kg).  General:  Well developed/obese in NAD Skin warm/dry Patient not depressed No peripheral clubbing Back-normal HEENT-normal/normal eyelids Neck supple/normal carotid upstroke bilaterally; no bruits; no JVD; no thyromegaly chest - CTA/ normal expansion CV - RRR/normal S1 and S2; no rubs or gallops;  PMI nondisplaced; 2/6 systolic murmur LSB Abdomen -NT/ND, no HSM, no mass, + bowel sounds, no bruit 2+ femoral pulses, no bruits Ext-trace edema, no chords, 2+ DP Neuro-grossly nonfocal  ECG -Sinus rhythm at a rate of 78. No ST changes. Right axis deviation.  A/P  1. Aortic insufficiency-plan repeat echocardiogram to reassess.  2 hypertension-blood pressure controlled. Continue present medications.  3 chest pain-symptoms are atypical. We will arrange an exercise treadmill for risk stratification.  4 dyspnea-echocardiogram to assess LV function.  5 morbid obesity-we discussed the importance of exercise and weight loss.  6 hyperlipidemia-continue statin.    Kirk Ruths MD

## 2016-05-19 ENCOUNTER — Ambulatory Visit
Admission: RE | Admit: 2016-05-19 | Discharge: 2016-05-19 | Disposition: A | Discharge status: Home / Self Care | Payer: BLUE CROSS/BLUE SHIELD | Source: Ambulatory Visit | Attending: Family Medicine | Admitting: Family Medicine

## 2016-05-19 DIAGNOSIS — Z1231 Encounter for screening mammogram for malignant neoplasm of breast: Secondary | ICD-10-CM

## 2016-05-20 ENCOUNTER — Encounter: Payer: Self-pay | Admitting: Cardiology

## 2016-05-20 ENCOUNTER — Ambulatory Visit (INDEPENDENT_AMBULATORY_CARE_PROVIDER_SITE_OTHER): Payer: BLUE CROSS/BLUE SHIELD | Admitting: Cardiology

## 2016-05-20 VITALS — BP 140/82 | HR 79 | Ht 63.0 in | Wt 255.1 lb

## 2016-05-20 DIAGNOSIS — I359 Nonrheumatic aortic valve disorder, unspecified: Secondary | ICD-10-CM

## 2016-05-20 DIAGNOSIS — I10 Essential (primary) hypertension: Secondary | ICD-10-CM

## 2016-05-20 DIAGNOSIS — R072 Precordial pain: Secondary | ICD-10-CM

## 2016-05-20 DIAGNOSIS — E7800 Pure hypercholesterolemia, unspecified: Secondary | ICD-10-CM | POA: Diagnosis not present

## 2016-05-20 NOTE — Patient Instructions (Signed)
Medication Instructions:   NO CHANGE  Testing/Procedures:  Your physician has requested that you have an echocardiogram. Echocardiography is a painless test that uses sound waves to create images of your heart. It provides your doctor with information about the size and shape of your heart and how well your heart's chambers and valves are working. This procedure takes approximately one hour. There are no restrictions for this procedure.   Your physician has requested that you have an exercise tolerance test. For further information please visit [email or web address]. Please also follow instruction sheet, as given.    Follow-Up:  Your physician wants you to follow-up in: [place] will receive a reminder letter in the mail two months in advance. If you don't receive a letter, please call our office to schedule the follow-up appointment.   If you need a refill on your cardiac medications before your next appointment, please call your pharmacy.    Exercise Stress Electrocardiogram An exercise stress electrocardiogram is a test to check how blood flows to your heart. It is done to find areas of poor blood flow. You will need to walk on a treadmill for this test. The electrocardiogram will record your heartbeat when you are at rest and when you are exercising. BEFORE THE PROCEDURE  Do not have drinks with caffeine or foods with caffeine for 24 hours before the test, or as told by your doctor. This includes coffee, tea (even decaf tea), sodas, chocolate, and cocoa.  Follow your doctor's instructions about eating and drinking before the test.  Ask your doctor what medicines you should or should not take before the test. Take your medicines with water unless told by your doctor not to.  If you use an inhaler, bring it with you to the test.  Bring a snack to eat after the test.  Do not  smoke for 4 hours before the test.  Do not put lotions, powders, creams, or oils on your  chest before the test.  Wear comfortable shoes and clothing. PROCEDURE  You will have patches put on your chest. Small areas of your chest may need to be shaved. Wires will be connected to the patches.  Your heart rate will be watched while you are resting and while you are exercising.  You will walk on the treadmill. The treadmill will slowly get faster to raise your heart rate.  The test will take about 1-2 hours. AFTER THE PROCEDURE  Your heart rate and blood pressure will be watched after the test.  You may return to your normal diet, activities, and medicines or as told by your doctor.   This information is not intended to replace advice given to you by your health care provider. Make sure you discuss any questions you have with your health care provider.   Document Released: 12/16/2007 Document Revised: 07/20/2014 Document Reviewed: 03/06/2013 Elsevier Interactive Patient Education [employer].

## 2016-05-22 ENCOUNTER — Telehealth (HOSPITAL_COMMUNITY): Payer: Self-pay

## 2016-05-22 NOTE — Telephone Encounter (Signed)
Encounter complete. 

## 2016-05-26 ENCOUNTER — Telehealth (HOSPITAL_COMMUNITY): Payer: Self-pay

## 2016-05-26 NOTE — Telephone Encounter (Signed)
Encounter complete. 

## 2016-05-27 ENCOUNTER — Ambulatory Visit (HOSPITAL_COMMUNITY)
Admission: RE | Admit: 2016-05-27 | Discharge: 2016-05-27 | Disposition: A | Discharge status: Home / Self Care | Payer: BLUE CROSS/BLUE SHIELD | Source: Ambulatory Visit | Attending: Cardiology | Admitting: Cardiology

## 2016-05-27 DIAGNOSIS — I359 Nonrheumatic aortic valve disorder, unspecified: Secondary | ICD-10-CM | POA: Insufficient documentation

## 2016-05-27 LAB — EXERCISE TOLERANCE TEST
CHL CUP RESTING HR STRESS: 98 {beats}/min
CHL RATE OF PERCEIVED EXERTION: 19
CSEPED: 3 min
CSEPEDS: 4 s
CSEPEW: 4.6 METS
CSEPHR: 90 %
CSEPPHR: 150 {beats}/min
MPHR: 166 {beats}/min

## 2016-05-28 ENCOUNTER — Telehealth: Payer: Self-pay | Admitting: *Deleted

## 2016-05-28 DIAGNOSIS — R9439 Abnormal result of other cardiovascular function study: Secondary | ICD-10-CM

## 2016-05-28 NOTE — Telephone Encounter (Signed)
Spoke with pt, aware of gxt results. lexiscan scheduled.

## 2016-05-28 NOTE — Telephone Encounter (Signed)
-----   Message from Lelon Perla, MD sent at 05/27/2016  5:10 PM EST ----- Schedule lexiscan nuclear study Kirk Ruths

## 2016-06-03 ENCOUNTER — Ambulatory Visit (HOSPITAL_BASED_OUTPATIENT_CLINIC_OR_DEPARTMENT_OTHER)
Admission: RE | Admit: 2016-06-03 | Discharge: 2016-06-03 | Disposition: A | Discharge status: Home / Self Care | Payer: BLUE CROSS/BLUE SHIELD | Source: Ambulatory Visit | Attending: Cardiology | Admitting: Cardiology

## 2016-06-03 ENCOUNTER — Telehealth (HOSPITAL_COMMUNITY): Payer: Self-pay

## 2016-06-03 DIAGNOSIS — I359 Nonrheumatic aortic valve disorder, unspecified: Secondary | ICD-10-CM | POA: Insufficient documentation

## 2016-06-03 NOTE — Telephone Encounter (Signed)
Encounter complete. 

## 2016-06-03 NOTE — Progress Notes (Signed)
Echocardiogram 2D Echocardiogram has been performed.  Aggie Cosier 06/03/2016, 1:06 PM

## 2016-06-10 ENCOUNTER — Ambulatory Visit (HOSPITAL_COMMUNITY)
Admission: RE | Admit: 2016-06-10 | Discharge: 2016-06-10 | Disposition: A | Discharge status: Home / Self Care | Payer: BLUE CROSS/BLUE SHIELD | Source: Ambulatory Visit | Attending: Cardiovascular Disease | Admitting: Cardiovascular Disease

## 2016-06-10 DIAGNOSIS — R9439 Abnormal result of other cardiovascular function study: Secondary | ICD-10-CM | POA: Diagnosis present

## 2016-06-10 MED ORDER — AMINOPHYLLINE 25 MG/ML IV SOLN
75.0000 mg | Freq: Once | INTRAVENOUS | Status: AC
  Administered 2016-06-10: 75 mg via INTRAVENOUS

## 2016-06-10 MED ORDER — REGADENOSON 0.4 MG/5ML IV SOLN
0.4000 mg | Freq: Once | INTRAVENOUS | Status: AC
  Administered 2016-06-10: 0.4 mg via INTRAVENOUS

## 2016-06-10 MED ORDER — TECHNETIUM TC 99M TETROFOSMIN IV KIT
31.5000 | PACK | Freq: Once | INTRAVENOUS | Status: AC | PRN
  Administered 2016-06-10: 31.5 via INTRAVENOUS
  Filled 2016-06-10: qty 32

## 2016-06-11 ENCOUNTER — Ambulatory Visit (HOSPITAL_COMMUNITY)
Admission: RE | Admit: 2016-06-11 | Discharge: 2016-06-11 | Disposition: A | Discharge status: Home / Self Care | Payer: BLUE CROSS/BLUE SHIELD | Source: Ambulatory Visit | Attending: Cardiology | Admitting: Cardiology

## 2016-06-11 LAB — MYOCARDIAL PERFUSION IMAGING
CHL CUP NUCLEAR SDS: 9
CHL CUP NUCLEAR SRS: 7
CHL CUP RESTING HR STRESS: 81 {beats}/min
CSEPPHR: 104 {beats}/min
LV sys vol: 14 mL
LVDIAVOL: 55 mL (ref 46–106)
SSS: 16
TID: 1.07

## 2016-06-11 MED ORDER — TECHNETIUM TC 99M TETROFOSMIN IV KIT
30.5000 | PACK | Freq: Once | INTRAVENOUS | Status: AC | PRN
  Administered 2016-06-11: 30.5 via INTRAVENOUS

## 2016-06-12 ENCOUNTER — Telehealth: Payer: Self-pay | Admitting: *Deleted

## 2016-06-12 NOTE — Telephone Encounter (Signed)
Pt returning call to [facility] regarding stress test results--pls call [phone]

## 2016-06-12 NOTE — Telephone Encounter (Signed)
-----   Message from Lelon Perla, MD sent at 06/12/2016  7:00 AM EST ----- FUov with PA; may need cath [facility]

## 2016-06-12 NOTE — Telephone Encounter (Signed)
Spoke with pt, Follow up scheduled  

## 2016-06-12 NOTE — Telephone Encounter (Signed)
Left message for pt to call to schedule

## 2016-06-15 ENCOUNTER — Encounter: Payer: Self-pay | Admitting: Physician Assistant

## 2016-06-15 ENCOUNTER — Ambulatory Visit (INDEPENDENT_AMBULATORY_CARE_PROVIDER_SITE_OTHER): Payer: BLUE CROSS/BLUE SHIELD | Admitting: Physician Assistant

## 2016-06-15 ENCOUNTER — Encounter: Payer: Self-pay | Admitting: Interventional Cardiology

## 2016-06-15 VITALS — BP 134/87 | HR 101 | Ht 63.0 in | Wt 255.0 lb

## 2016-06-15 DIAGNOSIS — R9439 Abnormal result of other cardiovascular function study: Secondary | ICD-10-CM | POA: Diagnosis not present

## 2016-06-15 DIAGNOSIS — Z01818 Encounter for other preprocedural examination: Secondary | ICD-10-CM | POA: Diagnosis not present

## 2016-06-15 DIAGNOSIS — I10 Essential (primary) hypertension: Secondary | ICD-10-CM

## 2016-06-15 DIAGNOSIS — R5383 Other fatigue: Secondary | ICD-10-CM

## 2016-06-15 DIAGNOSIS — D689 Coagulation defect, unspecified: Secondary | ICD-10-CM | POA: Diagnosis not present

## 2016-06-15 DIAGNOSIS — R079 Chest pain, unspecified: Secondary | ICD-10-CM | POA: Diagnosis not present

## 2016-06-15 MED ORDER — METOPROLOL TARTRATE 25 MG PO TABS: 25.0000 mg | ORAL_TABLET | Freq: Two times a day (BID) | ORAL | 11 refills | Status: DC

## 2016-06-15 NOTE — H&P (Signed)
Cardiology History and Physical   Date:  06/15/2016   ID:  [name], DOB August 29, 1960, MRN [id number]  PCP:  Tula Nakayama  Cardiologist:  Dr Alcide Evener, PA-C   Chief Complaint  Patient presents with  . Follow-up    here to discuss stress test    History of Present Illness: [name] is a 55 y.o. female with a history of AI, depression, chronic back pain, DM, HTN, HLD, OSA on CPAP  11/08 seen in office, CP>>GXT>>abnl>>MV>>abnl>>appt made. Echo also ordered to f/u on AI  [name] presents for follow up evaluation of chest pain.  She gets some chest pain every day. She will have symptoms that start at rest. She has not taken anything for the pain, wonders if it is indigestion. She might be able to walk up stairs, but would be  "wore out" at the top. She feels tired all time. She has neuropathy, so her activity is limited by that as well. She has had DOE for several years.   Her chest is tight for 2-3 hours a day. She does not wake with it. It is off/on throughout the day. Possibly with exertion, but this is not clear. DOE seems to limit her more than anything else. She gets a burning pain in a band all the way around her lower chest and upper abdomen, Neurologist did not feel it was from her neuropathy.   She has also been having episodes of diaphoresis, wonders if she is going into menopause.   No palpitations, no presyncope. She wears a brace on her L leg to help the foot drop. She has daytime LE edema, L>R, before she started wearing the brace. It has been like that for years, no recent change. Dopplers neg for DVT 2014 (done for the edema).    Past Medical History:  Diagnosis Date  . Aortic valve calcification    thickened  . Arthritis   . Chronic back pain    stenosis  . Depression    takes Lexapro daily  . Diabetes mellitus ([place])   . Headache   . History of bronchitis 2 yrs ago  . History of colon polyps    benign  .  Hyperlipidemia   . Hypertension    takes Lisinopril and HCTZ daily  . Insomnia    takes Xanax at bedtime  . Murmur, cardiac   . Nocturia   . OSA on CPAP   . PONV (postoperative nausea and vomiting)   . Urinary frequency    takes Ditropan nightly  . Urinary urgency   . Vertigo    hx of that required Meclizine   . Weakness    numbness and tingling in both legs and occasionally in the left    Past Surgical History:  Procedure Laterality Date  . ABDOMINAL HYSTERECTOMY  1998  . BACK SURGERY    . bladder tack     x 2   . CERVICAL FUSION    . CHOLECYSTECTOMY    . COLONOSCOPY      Current Outpatient Prescriptions  Medication Sig Dispense Refill  . acetaminophen (TYLENOL) 500 MG tablet Take 500 mg by mouth every 6 (six) hours as needed.    Marland Kitchen acetaminophen-codeine (TYLENOL #3) 300-30 MG tablet Take 1 tablet by mouth every 4 (four) hours as needed.    . ALPRAZolam (XANAX) 0.5 MG tablet Take 0.5 mg by mouth at bedtime.    Marland Kitchen amitriptyline (ELAVIL) 25 MG tablet  Take 25 mg by mouth at bedtime.    Marland Kitchen aspirin 81 MG tablet Take 81 mg by mouth daily.    Marland Kitchen atorvastatin (LIPITOR) 10 MG tablet Take 10 mg by mouth daily.    . carbamazepine (TEGRETOL) 200 MG tablet Take 400 mg by mouth 3 (three) times daily.     . cholecalciferol (VITAMIN D) 1000 UNITS tablet Take 1,000 Units by mouth daily.    Marland Kitchen escitalopram (LEXAPRO) 10 MG tablet Take 20 mg by mouth daily.    . fish oil-omega-3 fatty acids 1000 MG capsule Take 2 g by mouth daily.    Marland Kitchen lisinopril-hydrochlorothiazide (PRINZIDE,ZESTORETIC) 10-12.5 MG tablet Take 1 tablet by mouth daily.    . metFORMIN (GLUCOPHAGE) 500 MG tablet Take 500 mg by mouth as directed. 500 MG in the morning and 1500 mg in the evening    . oxyCODONE-acetaminophen (PERCOCET/ROXICET) 5-325 MG per tablet Take 1-2 tablets by mouth every 4 (four) hours as needed for moderate pain. 30 tablet 0  . tolterodine (DETROL LA) 4 MG 24 hr capsule Take 4 mg by mouth daily.     No  current facility-administered medications for this visit.     Allergies:   Patient has no known allergies.    Social History:  The patient  reports that she has quit smoking. She has never used smokeless tobacco. She reports that she does not drink alcohol or use drugs.   Family History:  The patient's family history includes Atrial fibrillation in her brother and mother; Bladder Cancer in her father; Cerebrovascular Disease in her brother and father; Colon cancer in her mother; Diabetes in her brother, brother, and sister; Hyperlipidemia in her mother; Hypertension in her brother, father, mother, and sister; Kidney cancer in her brother.   ROS:  Please see the history of present illness. All other systems are reviewed and negative.   PHYSICAL EXAM: VS:  BP 134/87 (BP Location: Left Arm, Patient Position: Sitting, Cuff Size: Large)   Pulse (!) 101   Ht 5\' 3"  (1.6 m)   Wt 255 lb (115.7 kg)   BMI 45.17 kg/m  , BMI Body mass index is 45.17 kg/m. GEN: Well nourished, well developed, female in no acute distress  HEENT: normal for age  Neck: no JVD, no carotid bruit, no masses Cardiac: RRR; soft murmur, no rubs, or gallops Respiratory:  clear to auscultation bilaterally, normal work of breathing GI: soft, nontender, nondistended, + BS MS: no deformity or atrophy; trace edema; distal pulses are 2+ in all 4 extremities   Skin: warm and dry, no rash Neuro:  Strength and sensation are intact Psych: euthymic mood, full affect   EKG:  EKG is ordered today. The ekg ordered today demonstrates   MYOVIEW: 06/11/2016  The left ventricular ejection fraction is hyperdynamic (>65%).  Nuclear stress EF: 74%.  There was no ST segment deviation noted during stress.  Defect 1: There is a medium defect of moderate severity present in the mid anterior, apical anterior and apex location.  Findings consistent with ischemia.  This is an intermediate risk study.  There is a small size moderate  severity reversible defect in the mid and apical anterior walls and in the true apex consistent with ischemia in the mid to distal LAD. (SDS =5)  ECHO: 06/03/2016 - Left ventricle: The cavity size was normal. Wall thickness was   normal. Systolic function was vigorous. The estimated ejection   fraction was in the range of 65% to 70%. Wall motion was  normal;   there were no regional wall motion abnormalities. Doppler   parameters are consistent with abnormal left ventricular   relaxation (grade 1 diastolic dysfunction).   Recent Labs: 12/08/2015: ALT 51; BUN 30; Creatinine, Ser 1.27; Hemoglobin 12.1; Platelets 218; Potassium 3.8; Sodium 135    Lipid Panel No results found for: CHOL, TRIG, HDL, CHOLHDL, VLDL, LDLCALC, LDLDIRECT   Wt Readings from Last 3 Encounters:  06/15/16 255 lb (115.7 kg)  06/10/16 255 lb (115.7 kg)  05/20/16 255 lb 1.9 oz (115.7 kg)     Other studies Reviewed: Additional studies/ records that were reviewed today include: office notes and testing.  ASSESSMENT AND PLAN:  1.  Chest pain: sx have atypical aspects. However, her Myoview was abnormal, with ischemia. Cardiac cath is indicated, first available. Continue ASA, statin. Add BB.   The risks and benefits of a cardiac catheterization including, but not limited to, death, stroke, MI, kidney damage and bleeding were discussed with the patient who indicates understanding and agrees to proceed.   2. HTN: pt BP slightly high, HR consistently high. Will add BB and see how tolerated. If BP too low, would decrease lisinopril/HCTZ and see how tolerated. Warned her that BB could make her fatigue worse, but encouraged her to take it till the cath, at least.  3. Hyperlipidemia: continue current statin, pt to obtain copies of her last labs and get them to [place].   4 Morbid obesity: encouraged her to follow her MDs instructions to get generally healthier and lose some weight.    Current medicines are reviewed at length with  the patient today.  The patient does not have concerns regarding medicines.  The following changes have been made:  Add BB  Labs/ tests ordered today include:   Orders Placed This Encounter  Procedures  . CBC  . Basic Metabolic Panel (BMET)  . INR/PT  . APTT  . TSH  . LEFT HEART CATHETERIZATION WITH CORONARY ANGIOGRAM     Disposition:   FU with Dr Stanford Breed  Signed, Rosaria Ferries, PA-C  06/15/2016 3:06 PM    [place] Phone: [phone]; Fax: [phone]  This note was written with the assistance of speech recognition software.  Please excuse any transcriptional errors.

## 2016-06-15 NOTE — Patient Instructions (Addendum)
Medication Instructions:  START- Metoprolol 25 mg twice a day  Labwork: Pre-op Labs  Testing/Procedures: Your physician has requested that you have a left heart cardiac catheterization. Cardiac catheterization is used to diagnose and/or treat various heart conditions. Doctors may recommend this procedure for a number of different reasons. The most common reason is to evaluate chest pain. Chest pain can be a symptom of coronary artery disease (CAD), and cardiac catheterization can show whether plaque is narrowing or blocking your heart's arteries. This procedure is also used to evaluate the valves, as well as measure the blood flow and oxygen levels in different parts of your heart. For further information please visit [email or web address]. Please follow instruction sheet, as given.   Follow-Up: Your physician recommends that you schedule a follow-up appointment in: After Cath with Dr Stanford Breed   Any Other Special Instructions Will Be Listed Below (If Applicable).   If you need a refill on your cardiac medications before your next appointment, please call your pharmacy.

## 2016-06-15 NOTE — Progress Notes (Signed)
Cardiology Office Note   Date:  06/15/2016   ID:  [name], DOB Sep 27, 1960, MRN [id number]  PCP:  [name]  Cardiologist:  Dr Darlene Evener, PA-C   Chief Complaint  Patient presents with  . Follow-up    here to discuss stress test    History of Present Illness: [name] is a 56 y.o. female with a history of AI, depression, chronic back pain, DM, HTN, HLD, OSA on CPAP  11/08 seen in office, CP>>GXT>>abnl>>MV>>abnl>>appt made. Echo also ordered to f/u on AI  [name] presents for follow up evaluation of chest pain.  She gets some chest pain every day. She will have symptoms that start at rest. She has not taken anything for the pain, wonders if it is indigestion. She might be able to walk up stairs, but would be  "wore out" at the top. She feels tired all time. She has neuropathy, so her activity is limited by that as well. She has had DOE for several years.   Her chest is tight for 2-3 hours a day. She does not wake with it. It is off/on throughout the day. Possibly with exertion, but this is not clear. DOE seems to limit her more than anything else. She gets a burning pain in a band all the way around her lower chest and upper abdomen, Neurologist did not feel it was from her neuropathy.   She has also been having episodes of diaphoresis, wonders if she is going into menopause.   No palpitations, no presyncope. She wears a brace on her L leg to help the foot drop. She has daytime LE edema, L>R, before she started wearing the brace. It has been like that for years, no recent change. Dopplers neg for DVT 2014 (done for the edema).    Past Medical History:  Diagnosis Date  . Aortic valve calcification    thickened  . Arthritis   . Chronic back pain    stenosis  . Depression    takes Lexapro daily  . Diabetes mellitus ([place])   . Headache   . History of bronchitis 2 yrs ago  . History of colon polyps    benign  . Hyperlipidemia    . Hypertension    takes Lisinopril and HCTZ daily  . Insomnia    takes Xanax at bedtime  . Murmur, cardiac   . Nocturia   . OSA on CPAP   . PONV (postoperative nausea and vomiting)   . Urinary frequency    takes Ditropan nightly  . Urinary urgency   . Vertigo    hx of that required Meclizine   . Weakness    numbness and tingling in both legs and occasionally in the left    Past Surgical History:  Procedure Laterality Date  . ABDOMINAL HYSTERECTOMY  1998  . BACK SURGERY    . bladder tack     x 2   . CERVICAL FUSION    . CHOLECYSTECTOMY    . COLONOSCOPY      Current Outpatient Prescriptions  Medication Sig Dispense Refill  . acetaminophen (TYLENOL) 500 MG tablet Take 500 mg by mouth every 6 (six) hours as needed.    Marland Kitchen acetaminophen-codeine (TYLENOL #3) 300-30 MG tablet Take 1 tablet by mouth every 4 (four) hours as needed.    . ALPRAZolam (XANAX) 0.5 MG tablet Take 0.5 mg by mouth at bedtime.    Marland Kitchen amitriptyline (ELAVIL) 25 MG tablet Take  25 mg by mouth at bedtime.    Marland Kitchen aspirin 81 MG tablet Take 81 mg by mouth daily.    Marland Kitchen atorvastatin (LIPITOR) 10 MG tablet Take 10 mg by mouth daily.    . carbamazepine (TEGRETOL) 200 MG tablet Take 400 mg by mouth 3 (three) times daily.     . cholecalciferol (VITAMIN D) 1000 UNITS tablet Take 1,000 Units by mouth daily.    Marland Kitchen escitalopram (LEXAPRO) 10 MG tablet Take 20 mg by mouth daily.    . fish oil-omega-3 fatty acids 1000 MG capsule Take 2 g by mouth daily.    Marland Kitchen lisinopril-hydrochlorothiazide (PRINZIDE,ZESTORETIC) 10-12.5 MG tablet Take 1 tablet by mouth daily.    . metFORMIN (GLUCOPHAGE) 500 MG tablet Take 500 mg by mouth as directed. 500 MG in the morning and 1500 mg in the evening    . oxyCODONE-acetaminophen (PERCOCET/ROXICET) 5-325 MG per tablet Take 1-2 tablets by mouth every 4 (four) hours as needed for moderate pain. 30 tablet 0  . tolterodine (DETROL LA) 4 MG 24 hr capsule Take 4 mg by mouth daily.     No current  facility-administered medications for this visit.     Allergies:   Patient has no known allergies.    Social History:  The patient  reports that she has quit smoking. She has never used smokeless tobacco. She reports that she does not drink alcohol or use drugs.   Family History:  The patient's family history includes Atrial fibrillation in her brother and mother; Bladder Cancer in her father; Cerebrovascular Disease in her brother and father; Colon cancer in her mother; Diabetes in her brother, brother, and sister; Hyperlipidemia in her mother; Hypertension in her brother, father, mother, and sister; Kidney cancer in her brother.   ROS:  Please see the history of present illness. All other systems are reviewed and negative.   PHYSICAL EXAM: VS:  BP 134/87 (BP Location: Left Arm, Patient Position: Sitting, Cuff Size: Large)   Pulse (!) 101   Ht 5\' 3"  (1.6 m)   Wt 255 lb (115.7 kg)   BMI 45.17 kg/m  , BMI Body mass index is 45.17 kg/m. GEN: Well nourished, well developed, female in no acute distress  HEENT: normal for age  Neck: no JVD, no carotid bruit, no masses Cardiac: RRR; soft murmur, no rubs, or gallops Respiratory:  clear to auscultation bilaterally, normal work of breathing GI: soft, nontender, nondistended, + BS MS: no deformity or atrophy; trace edema; distal pulses are 2+ in all 4 extremities   Skin: warm and dry, no rash Neuro:  Strength and sensation are intact Psych: euthymic mood, full affect   EKG:  EKG is ordered today. The ekg ordered today demonstrates   MYOVIEW: 06/11/2016  The left ventricular ejection fraction is hyperdynamic (>65%).  Nuclear stress EF: 74%.  There was no ST segment deviation noted during stress.  Defect 1: There is a medium defect of moderate severity present in the mid anterior, apical anterior and apex location.  Findings consistent with ischemia.  This is an intermediate risk study.  There is a small size moderate severity  reversible defect in the mid and apical anterior walls and in the true apex consistent with ischemia in the mid to distal LAD. (SDS =5)  ECHO: 06/03/2016 - Left ventricle: The cavity size was normal. Wall thickness was   normal. Systolic function was vigorous. The estimated ejection   fraction was in the range of 65% to 70%. Wall motion was normal;  there were no regional wall motion abnormalities. Doppler   parameters are consistent with abnormal left ventricular   relaxation (grade 1 diastolic dysfunction).   Recent Labs: 12/08/2015: ALT 51; BUN 30; Creatinine, Ser 1.27; Hemoglobin 12.1; Platelets 218; Potassium 3.8; Sodium 135    Lipid Panel No results found for: CHOL, TRIG, HDL, CHOLHDL, VLDL, LDLCALC, LDLDIRECT   Wt Readings from Last 3 Encounters:  06/15/16 255 lb (115.7 kg)  06/10/16 255 lb (115.7 kg)  05/20/16 255 lb 1.9 oz (115.7 kg)     Other studies Reviewed: Additional studies/ records that were reviewed today include: office notes and testing.  ASSESSMENT AND PLAN:  1.  Chest pain: sx have atypical aspects. However, her Myoview was abnormal, with ischemia. Cardiac cath is indicated, first available. Continue ASA, statin. Add BB.   2. HTN: pt BP slightly high, HR consistently high. Will add BB and see how tolerated. If BP too low, would decrease lisinopril/HCTZ and see how tolerated. Warned her that BB could make her fatigue worse, but encouraged her to take it till the cath, at least.  3. Hyperlipidemia: continue current statin, pt to obtain copies of her last labs and get them to [place].   4 Morbid obesity: encouraged her to follow her MDs instructions to get generally healthier and lose some weight.    Current medicines are reviewed at length with the patient today.  The patient does not have concerns regarding medicines.  The following changes have been made:  Add BB  Labs/ tests ordered today include:   Orders Placed This Encounter  Procedures  . CBC  .  Basic Metabolic Panel (BMET)  . INR/PT  . APTT  . TSH  . LEFT HEART CATHETERIZATION WITH CORONARY ANGIOGRAM     Disposition:   FU with Dr Stanford Breed  Signed, Rosaria Ferries, PA-C  06/15/2016 3:06 PM    [place] Phone: [phone]; Fax: [phone]  This note was written with the assistance of speech recognition software. Please excuse any transcriptional errors.

## 2016-06-16 LAB — TSH: TSH: 1.14 m[IU]/L

## 2016-06-16 LAB — BASIC METABOLIC PANEL
BUN: 17 mg/dL (ref 7–25)
CALCIUM: 10.4 mg/dL (ref 8.6–10.4)
CO2: 27 mmol/L (ref 20–31)
Chloride: 102 mmol/L (ref 98–110)
Creat: 0.9 mg/dL (ref 0.50–1.05)
Glucose, Bld: 122 mg/dL — ABNORMAL HIGH (ref 65–99)
POTASSIUM: 4.1 mmol/L (ref 3.5–5.3)
SODIUM: 141 mmol/L (ref 135–146)

## 2016-06-16 LAB — CBC
HCT: 42.1 % (ref 35.0–45.0)
Hemoglobin: 14.2 g/dL (ref 11.7–15.5)
MCH: 32.6 pg (ref 27.0–33.0)
MCHC: 33.7 g/dL (ref 32.0–36.0)
MCV: 96.8 fL (ref 80.0–100.0)
MPV: 9 fL (ref 7.5–12.5)
PLATELETS: 282 10*3/uL (ref 140–400)
RBC: 4.35 MIL/uL (ref 3.80–5.10)
RDW: 13 % (ref 11.0–15.0)
WBC: 6.6 10*3/uL (ref 3.8–10.8)

## 2016-06-16 LAB — PROTIME-INR
INR: 1
PROTHROMBIN TIME: 10.5 s (ref 9.0–11.5)

## 2016-06-16 LAB — APTT: APTT: 25 s (ref 22–34)

## 2016-06-23 ENCOUNTER — Encounter (HOSPITAL_COMMUNITY): Payer: Self-pay | Admitting: *Deleted

## 2016-06-23 ENCOUNTER — Ambulatory Visit (HOSPITAL_COMMUNITY)
Admission: RE | Admit: 2016-06-23 | Discharge: 2016-06-23 | Disposition: A | Discharge status: Home / Self Care | Payer: BLUE CROSS/BLUE SHIELD | Source: Ambulatory Visit | Attending: Interventional Cardiology | Admitting: Interventional Cardiology

## 2016-06-23 ENCOUNTER — Encounter (HOSPITAL_COMMUNITY)
Admission: RE | Disposition: A | Discharge status: Home / Self Care | Payer: Self-pay | Source: Ambulatory Visit | Attending: Interventional Cardiology

## 2016-06-23 DIAGNOSIS — Z7984 Long term (current) use of oral hypoglycemic drugs: Secondary | ICD-10-CM | POA: Diagnosis not present

## 2016-06-23 DIAGNOSIS — I10 Essential (primary) hypertension: Secondary | ICD-10-CM | POA: Diagnosis not present

## 2016-06-23 DIAGNOSIS — M21372 Foot drop, left foot: Secondary | ICD-10-CM | POA: Insufficient documentation

## 2016-06-23 DIAGNOSIS — Z823 Family history of stroke: Secondary | ICD-10-CM | POA: Diagnosis not present

## 2016-06-23 DIAGNOSIS — I2510 Atherosclerotic heart disease of native coronary artery without angina pectoris: Secondary | ICD-10-CM | POA: Insufficient documentation

## 2016-06-23 DIAGNOSIS — R0789 Other chest pain: Secondary | ICD-10-CM | POA: Insufficient documentation

## 2016-06-23 DIAGNOSIS — Z833 Family history of diabetes mellitus: Secondary | ICD-10-CM | POA: Insufficient documentation

## 2016-06-23 DIAGNOSIS — G8929 Other chronic pain: Secondary | ICD-10-CM | POA: Diagnosis not present

## 2016-06-23 DIAGNOSIS — R3915 Urgency of urination: Secondary | ICD-10-CM | POA: Insufficient documentation

## 2016-06-23 DIAGNOSIS — Z6841 Body Mass Index (BMI) 40.0 and over, adult: Secondary | ICD-10-CM | POA: Diagnosis not present

## 2016-06-23 DIAGNOSIS — M549 Dorsalgia, unspecified: Secondary | ICD-10-CM | POA: Diagnosis not present

## 2016-06-23 DIAGNOSIS — G4733 Obstructive sleep apnea (adult) (pediatric): Secondary | ICD-10-CM | POA: Diagnosis not present

## 2016-06-23 DIAGNOSIS — M1990 Unspecified osteoarthritis, unspecified site: Secondary | ICD-10-CM | POA: Diagnosis not present

## 2016-06-23 DIAGNOSIS — Z7982 Long term (current) use of aspirin: Secondary | ICD-10-CM | POA: Diagnosis not present

## 2016-06-23 DIAGNOSIS — Z8249 Family history of ischemic heart disease and other diseases of the circulatory system: Secondary | ICD-10-CM | POA: Diagnosis not present

## 2016-06-23 DIAGNOSIS — E1140 Type 2 diabetes mellitus with diabetic neuropathy, unspecified: Secondary | ICD-10-CM | POA: Insufficient documentation

## 2016-06-23 DIAGNOSIS — R351 Nocturia: Secondary | ICD-10-CM | POA: Insufficient documentation

## 2016-06-23 DIAGNOSIS — Z8052 Family history of malignant neoplasm of bladder: Secondary | ICD-10-CM | POA: Insufficient documentation

## 2016-06-23 DIAGNOSIS — Z8051 Family history of malignant neoplasm of kidney: Secondary | ICD-10-CM | POA: Diagnosis not present

## 2016-06-23 DIAGNOSIS — G4700 Insomnia, unspecified: Secondary | ICD-10-CM | POA: Diagnosis not present

## 2016-06-23 DIAGNOSIS — Z800 Family history of malignant neoplasm of digestive organs: Secondary | ICD-10-CM | POA: Diagnosis not present

## 2016-06-23 DIAGNOSIS — E785 Hyperlipidemia, unspecified: Secondary | ICD-10-CM | POA: Insufficient documentation

## 2016-06-23 DIAGNOSIS — F329 Major depressive disorder, single episode, unspecified: Secondary | ICD-10-CM | POA: Diagnosis not present

## 2016-06-23 DIAGNOSIS — R9439 Abnormal result of other cardiovascular function study: Secondary | ICD-10-CM

## 2016-06-23 DIAGNOSIS — Z87891 Personal history of nicotine dependence: Secondary | ICD-10-CM | POA: Diagnosis not present

## 2016-06-23 DIAGNOSIS — Z86010 Personal history of colonic polyps: Secondary | ICD-10-CM | POA: Insufficient documentation

## 2016-06-23 HISTORY — PX: CARDIAC CATHETERIZATION: SHX172

## 2016-06-23 LAB — GLUCOSE, CAPILLARY: Glucose-Capillary: 136 mg/dL — ABNORMAL HIGH (ref 65–99)

## 2016-06-23 SURGERY — LEFT HEART CATH AND CORONARY ANGIOGRAPHY

## 2016-06-23 MED ORDER — SODIUM CHLORIDE 0.9% FLUSH
3.0000 mL | INTRAVENOUS | Status: DC | PRN
Start: 2016-06-23 — End: 2016-06-23

## 2016-06-23 MED ORDER — LIDOCAINE HCL (PF) 1 % IJ SOLN
INTRAMUSCULAR | Status: AC
  Filled 2016-06-23: qty 30

## 2016-06-23 MED ORDER — NITROGLYCERIN 1 MG/10 ML FOR IR/CATH LAB
INTRA_ARTERIAL | Status: AC
  Filled 2016-06-23: qty 10

## 2016-06-23 MED ORDER — LIDOCAINE HCL (PF) 1 % IJ SOLN
INTRAMUSCULAR | Status: DC | PRN
  Administered 2016-06-23: 2 mL

## 2016-06-23 MED ORDER — ASPIRIN 81 MG PO CHEW
CHEWABLE_TABLET | ORAL | Status: AC
  Administered 2016-06-23: 324 mg via ORAL
  Filled 2016-06-23: qty 4

## 2016-06-23 MED ORDER — IOPAMIDOL (ISOVUE-370) INJECTION 76%
INTRAVENOUS | Status: AC
  Filled 2016-06-23: qty 100

## 2016-06-23 MED ORDER — SODIUM CHLORIDE 0.9 % WEIGHT BASED INFUSION
3.0000 mL/kg/h | INTRAVENOUS | Status: DC
  Administered 2016-06-23: 3 mL/kg/h via INTRAVENOUS
  Administered 2016-06-23: 250 mL via INTRAVENOUS

## 2016-06-23 MED ORDER — HEPARIN (PORCINE) IN NACL 2-0.9 UNIT/ML-% IJ SOLN
INTRAMUSCULAR | Status: AC
  Filled 2016-06-23: qty 1000

## 2016-06-23 MED ORDER — IOPAMIDOL (ISOVUE-370) INJECTION 76%
INTRAVENOUS | Status: DC | PRN
  Administered 2016-06-23: 105 mL

## 2016-06-23 MED ORDER — HEPARIN (PORCINE) IN NACL 2-0.9 UNIT/ML-% IJ SOLN
INTRAMUSCULAR | Status: DC | PRN
  Administered 2016-06-23: 11:00:00

## 2016-06-23 MED ORDER — HEPARIN SODIUM (PORCINE) 1000 UNIT/ML IJ SOLN
INTRAMUSCULAR | Status: AC
  Filled 2016-06-23: qty 1

## 2016-06-23 MED ORDER — SODIUM CHLORIDE 0.9 % IV SOLN: 250.0000 mL | INTRAVENOUS | Status: DC | PRN

## 2016-06-23 MED ORDER — FENTANYL CITRATE (PF) 100 MCG/2ML IJ SOLN
INTRAMUSCULAR | Status: AC
  Filled 2016-06-23: qty 2

## 2016-06-23 MED ORDER — MIDAZOLAM HCL 2 MG/2ML IJ SOLN
INTRAMUSCULAR | Status: AC
  Filled 2016-06-23: qty 2

## 2016-06-23 MED ORDER — HEPARIN SODIUM (PORCINE) 1000 UNIT/ML IJ SOLN
INTRAMUSCULAR | Status: DC | PRN
  Administered 2016-06-23: 5500 [IU] via INTRAVENOUS

## 2016-06-23 MED ORDER — MIDAZOLAM HCL 2 MG/2ML IJ SOLN
INTRAMUSCULAR | Status: DC | PRN
  Administered 2016-06-23: 1 mg via INTRAVENOUS

## 2016-06-23 MED ORDER — IOPAMIDOL (ISOVUE-370) INJECTION 76%
INTRAVENOUS | Status: AC
  Filled 2016-06-23: qty 50

## 2016-06-23 MED ORDER — ASPIRIN 81 MG PO CHEW
324.0000 mg | CHEWABLE_TABLET | ORAL | Status: AC
  Administered 2016-06-23: 324 mg via ORAL

## 2016-06-23 MED ORDER — VERAPAMIL HCL 2.5 MG/ML IV SOLN
INTRAVENOUS | Status: DC | PRN
Start: 2016-06-23 — End: 2016-06-23
  Administered 2016-06-23: 10 mL via INTRA_ARTERIAL

## 2016-06-23 MED ORDER — SODIUM CHLORIDE 0.9 % WEIGHT BASED INFUSION: 1.0000 mL/kg/h | INTRAVENOUS | Status: DC

## 2016-06-23 MED ORDER — VERAPAMIL HCL 2.5 MG/ML IV SOLN
INTRAVENOUS | Status: AC
  Filled 2016-06-23: qty 2

## 2016-06-23 MED ORDER — SODIUM CHLORIDE 0.9% FLUSH: 3.0000 mL | Freq: Two times a day (BID) | INTRAVENOUS | Status: DC

## 2016-06-23 MED ORDER — FENTANYL CITRATE (PF) 100 MCG/2ML IJ SOLN
INTRAMUSCULAR | Status: DC | PRN
  Administered 2016-06-23: 50 ug via INTRAVENOUS

## 2016-06-23 MED ORDER — NITROGLYCERIN 1 MG/10 ML FOR IR/CATH LAB
INTRA_ARTERIAL | Status: DC | PRN
  Administered 2016-06-23: 200 ug via INTRACORONARY

## 2016-06-23 SURGICAL SUPPLY — 11 items
CATH EXPO 5F FL3.5 (CATHETERS) ×3 IMPLANT
CATH EXPO 5FR FR4 (CATHETERS) ×3 IMPLANT
DEVICE RAD COMP TR BAND LRG (VASCULAR PRODUCTS) ×3 IMPLANT
GLIDESHEATH SLEND A-KIT 6F 22G (SHEATH) ×3 IMPLANT
GUIDEWIRE INQWIRE 1.5J.035X260 (WIRE) ×1 IMPLANT
INQWIRE 1.5J .035X260CM (WIRE) ×3
KIT HEART LEFT (KITS) ×3 IMPLANT
PACK CARDIAC CATHETERIZATION (CUSTOM PROCEDURE TRAY) ×3 IMPLANT
TRANSDUCER W/STOPCOCK (MISCELLANEOUS) ×3 IMPLANT
TUBING CIL FLEX 10 FLL-RA (TUBING) ×3 IMPLANT
WIRE HI TORQ VERSACORE-J 145CM (WIRE) ×3 IMPLANT

## 2016-06-23 NOTE — Discharge Instructions (Signed)

## 2016-06-23 NOTE — CV Procedure (Signed)
   No significant obstructive disease noted.  Procedure accomplished from the right radial approach.

## 2016-06-23 NOTE — Interval H&P Note (Signed)
Cath Lab Visit (complete for each Cath Lab visit)  Clinical Evaluation Leading to the Procedure:   ACS: No.  Non-ACS:    Anginal Classification: CCS III  Anti-ischemic medical therapy: Minimal Therapy (1 class of medications)  Non-Invasive Test Results: Intermediate-risk stress test findings: cardiac mortality 1-3%/year  Prior CABG: No previous CABG      History and Physical Interval Note:  06/23/2016 10:26 AM  [name]  has presented today for surgery, with the diagnosis of cp - abnormal stress test  The various methods of treatment have been discussed with the patient and family. After consideration of risks, benefits and other options for treatment, the patient has consented to  Procedure(s): Left Heart Cath and Coronary Angiography (N/A) as a surgical intervention .  The patient's history has been reviewed, patient examined, no change in status, stable for surgery.  I have reviewed the patient's chart and labs.  Questions were answered to the patient's satisfaction.     Belva Crome III

## 2016-08-03 NOTE — Addendum Note (Signed)
Addended by: Waylan Rocher on: 08/03/2016 09:42 AM   Modules accepted: Orders

## 2016-08-11 ENCOUNTER — Other Ambulatory Visit: Payer: Self-pay

## 2016-08-11 MED ORDER — METOPROLOL TARTRATE 25 MG PO TABS
25.0000 mg | ORAL_TABLET | Freq: Two times a day (BID) | ORAL | 11 refills | Status: DC
Start: 2016-08-11 — End: 2021-03-02

## 2016-10-06 DIAGNOSIS — F419 Anxiety disorder, unspecified: Secondary | ICD-10-CM | POA: Diagnosis not present

## 2016-10-06 DIAGNOSIS — E669 Obesity, unspecified: Secondary | ICD-10-CM | POA: Diagnosis not present

## 2016-10-06 DIAGNOSIS — I10 Essential (primary) hypertension: Secondary | ICD-10-CM | POA: Diagnosis not present

## 2016-10-06 DIAGNOSIS — E119 Type 2 diabetes mellitus without complications: Secondary | ICD-10-CM | POA: Diagnosis not present

## 2016-11-19 DIAGNOSIS — M5116 Intervertebral disc disorders with radiculopathy, lumbar region: Secondary | ICD-10-CM | POA: Diagnosis not present

## 2016-11-19 DIAGNOSIS — G959 Disease of spinal cord, unspecified: Secondary | ICD-10-CM | POA: Diagnosis not present

## 2016-11-19 DIAGNOSIS — G8929 Other chronic pain: Secondary | ICD-10-CM | POA: Diagnosis not present

## 2016-11-19 DIAGNOSIS — M545 Low back pain: Secondary | ICD-10-CM | POA: Diagnosis not present

## 2016-11-19 DIAGNOSIS — M5416 Radiculopathy, lumbar region: Secondary | ICD-10-CM | POA: Diagnosis not present

## 2016-12-11 DIAGNOSIS — M25561 Pain in right knee: Secondary | ICD-10-CM | POA: Diagnosis not present

## 2016-12-21 DIAGNOSIS — M25561 Pain in right knee: Secondary | ICD-10-CM | POA: Diagnosis not present

## 2016-12-23 DIAGNOSIS — S83281A Other tear of lateral meniscus, current injury, right knee, initial encounter: Secondary | ICD-10-CM | POA: Diagnosis not present

## 2016-12-23 DIAGNOSIS — S83241A Other tear of medial meniscus, current injury, right knee, initial encounter: Secondary | ICD-10-CM | POA: Diagnosis not present

## 2017-01-04 ENCOUNTER — Telehealth: Payer: Self-pay | Admitting: Cardiology

## 2017-01-04 NOTE — Telephone Encounter (Signed)
Will fax this note to the number provided. 

## 2017-01-04 NOTE — Telephone Encounter (Signed)
Ok for surgery [name]  

## 2017-01-04 NOTE — Telephone Encounter (Signed)
New message    [name] is calling to find out about surgical clearance that was faxed over on 6/21.     [facility] Pre-operative Risk Assessment    Request for surgical clearance:  1. What type of surgery is being performed? Right Knee Scope   2. When is this surgery scheduled? 6/27  3. Are there any medications that need to be held prior to surgery and how long? She states that pt is on aspirin, which she can stay on but if there is anything more they need to know the directions  4. Name of physician performing surgery? Dr. Dorna Leitz   5. What is your office phone and fax number? [place] 01/04/2017, 3:28 PM  _________________________________________________________________   (provider comments below)

## 2017-01-06 DIAGNOSIS — M23221 Derangement of posterior horn of medial meniscus due to old tear or injury, right knee: Secondary | ICD-10-CM | POA: Diagnosis not present

## 2017-01-06 DIAGNOSIS — M94261 Chondromalacia, right knee: Secondary | ICD-10-CM | POA: Diagnosis not present

## 2017-01-06 DIAGNOSIS — M6751 Plica syndrome, right knee: Secondary | ICD-10-CM | POA: Diagnosis not present

## 2017-01-06 DIAGNOSIS — M23261 Derangement of other lateral meniscus due to old tear or injury, right knee: Secondary | ICD-10-CM | POA: Diagnosis not present

## 2017-01-06 DIAGNOSIS — G8918 Other acute postprocedural pain: Secondary | ICD-10-CM | POA: Diagnosis not present

## 2017-01-12 DIAGNOSIS — M23221 Derangement of posterior horn of medial meniscus due to old tear or injury, right knee: Secondary | ICD-10-CM | POA: Diagnosis not present

## 2017-01-28 DIAGNOSIS — Z0000 Encounter for general adult medical examination without abnormal findings: Secondary | ICD-10-CM | POA: Diagnosis not present

## 2017-01-28 DIAGNOSIS — G4733 Obstructive sleep apnea (adult) (pediatric): Secondary | ICD-10-CM | POA: Diagnosis not present

## 2017-01-28 DIAGNOSIS — G4700 Insomnia, unspecified: Secondary | ICD-10-CM | POA: Diagnosis not present

## 2017-01-28 DIAGNOSIS — Z9989 Dependence on other enabling machines and devices: Secondary | ICD-10-CM | POA: Diagnosis not present

## 2017-01-28 DIAGNOSIS — E782 Mixed hyperlipidemia: Secondary | ICD-10-CM | POA: Diagnosis not present

## 2017-01-28 DIAGNOSIS — E119 Type 2 diabetes mellitus without complications: Secondary | ICD-10-CM | POA: Diagnosis not present

## 2017-01-28 DIAGNOSIS — I10 Essential (primary) hypertension: Secondary | ICD-10-CM | POA: Diagnosis not present

## 2017-01-28 DIAGNOSIS — F419 Anxiety disorder, unspecified: Secondary | ICD-10-CM | POA: Diagnosis not present

## 2017-02-02 DIAGNOSIS — M1711 Unilateral primary osteoarthritis, right knee: Secondary | ICD-10-CM | POA: Diagnosis not present

## 2017-02-25 DIAGNOSIS — G959 Disease of spinal cord, unspecified: Secondary | ICD-10-CM | POA: Diagnosis not present

## 2017-02-25 DIAGNOSIS — G4700 Insomnia, unspecified: Secondary | ICD-10-CM | POA: Diagnosis not present

## 2017-02-25 DIAGNOSIS — R202 Paresthesia of skin: Secondary | ICD-10-CM | POA: Diagnosis not present

## 2017-04-03 DIAGNOSIS — M1711 Unilateral primary osteoarthritis, right knee: Secondary | ICD-10-CM | POA: Diagnosis not present

## 2017-04-08 ENCOUNTER — Other Ambulatory Visit: Payer: Self-pay | Admitting: Family Medicine

## 2017-04-08 DIAGNOSIS — Z1239 Encounter for other screening for malignant neoplasm of breast: Secondary | ICD-10-CM

## 2017-04-20 DIAGNOSIS — M1711 Unilateral primary osteoarthritis, right knee: Secondary | ICD-10-CM | POA: Diagnosis not present

## 2017-04-27 DIAGNOSIS — M1711 Unilateral primary osteoarthritis, right knee: Secondary | ICD-10-CM | POA: Diagnosis not present

## 2017-05-04 DIAGNOSIS — M1711 Unilateral primary osteoarthritis, right knee: Secondary | ICD-10-CM | POA: Diagnosis not present

## 2017-05-21 ENCOUNTER — Ambulatory Visit
Admission: RE | Admit: 2017-05-21 | Discharge: 2017-05-21 | Disposition: A | Discharge status: Home / Self Care | Payer: BLUE CROSS/BLUE SHIELD | Source: Ambulatory Visit | Attending: Family Medicine | Admitting: Family Medicine

## 2017-05-21 DIAGNOSIS — Z1231 Encounter for screening mammogram for malignant neoplasm of breast: Secondary | ICD-10-CM | POA: Diagnosis not present

## 2017-05-21 DIAGNOSIS — Z1239 Encounter for other screening for malignant neoplasm of breast: Secondary | ICD-10-CM

## 2017-06-10 DIAGNOSIS — M546 Pain in thoracic spine: Secondary | ICD-10-CM | POA: Diagnosis not present

## 2017-06-10 DIAGNOSIS — M5416 Radiculopathy, lumbar region: Secondary | ICD-10-CM | POA: Diagnosis not present

## 2017-06-10 DIAGNOSIS — G959 Disease of spinal cord, unspecified: Secondary | ICD-10-CM | POA: Diagnosis not present

## 2017-06-10 DIAGNOSIS — M5116 Intervertebral disc disorders with radiculopathy, lumbar region: Secondary | ICD-10-CM | POA: Diagnosis not present

## 2017-06-24 DIAGNOSIS — M5441 Lumbago with sciatica, right side: Secondary | ICD-10-CM | POA: Diagnosis not present

## 2017-06-24 DIAGNOSIS — K769 Liver disease, unspecified: Secondary | ICD-10-CM | POA: Diagnosis not present

## 2017-06-24 DIAGNOSIS — E119 Type 2 diabetes mellitus without complications: Secondary | ICD-10-CM | POA: Diagnosis not present

## 2017-06-24 DIAGNOSIS — E7800 Pure hypercholesterolemia, unspecified: Secondary | ICD-10-CM | POA: Diagnosis not present

## 2017-06-24 DIAGNOSIS — G8929 Other chronic pain: Secondary | ICD-10-CM | POA: Diagnosis not present

## 2017-06-24 DIAGNOSIS — K760 Fatty (change of) liver, not elsewhere classified: Secondary | ICD-10-CM | POA: Diagnosis not present

## 2017-06-24 DIAGNOSIS — M5442 Lumbago with sciatica, left side: Secondary | ICD-10-CM | POA: Diagnosis not present

## 2017-06-24 DIAGNOSIS — I10 Essential (primary) hypertension: Secondary | ICD-10-CM | POA: Diagnosis not present

## 2017-06-25 ENCOUNTER — Other Ambulatory Visit: Payer: Self-pay | Admitting: Family Medicine

## 2017-06-25 DIAGNOSIS — K769 Liver disease, unspecified: Secondary | ICD-10-CM

## 2017-06-26 ENCOUNTER — Ambulatory Visit (HOSPITAL_BASED_OUTPATIENT_CLINIC_OR_DEPARTMENT_OTHER)
Admission: RE | Admit: 2017-06-26 | Discharge: 2017-06-26 | Disposition: A | Discharge status: Home / Self Care | Payer: Medicare HMO | Source: Ambulatory Visit | Attending: Family Medicine | Admitting: Family Medicine

## 2017-06-26 DIAGNOSIS — K769 Liver disease, unspecified: Secondary | ICD-10-CM

## 2017-06-26 DIAGNOSIS — K7689 Other specified diseases of liver: Secondary | ICD-10-CM | POA: Diagnosis not present

## 2017-06-26 DIAGNOSIS — Z981 Arthrodesis status: Secondary | ICD-10-CM | POA: Insufficient documentation

## 2017-06-26 DIAGNOSIS — Z9049 Acquired absence of other specified parts of digestive tract: Secondary | ICD-10-CM | POA: Insufficient documentation

## 2017-06-26 MED ORDER — IOPAMIDOL (ISOVUE-300) INJECTION 61%
100.0000 mL | Freq: Once | INTRAVENOUS | Status: AC | PRN
  Administered 2017-06-26: 100 mL via INTRAVENOUS

## 2017-07-10 DIAGNOSIS — M25561 Pain in right knee: Secondary | ICD-10-CM | POA: Diagnosis not present

## 2017-07-30 ENCOUNTER — Ambulatory Visit (HOSPITAL_BASED_OUTPATIENT_CLINIC_OR_DEPARTMENT_OTHER)
Admission: RE | Admit: 2017-07-30 | Discharge: 2017-07-30 | Disposition: A | Discharge status: Home / Self Care | Payer: Medicare HMO | Source: Ambulatory Visit | Attending: Physician Assistant | Admitting: Physician Assistant

## 2017-07-30 ENCOUNTER — Other Ambulatory Visit (HOSPITAL_BASED_OUTPATIENT_CLINIC_OR_DEPARTMENT_OTHER): Payer: Self-pay | Admitting: Physician Assistant

## 2017-07-30 DIAGNOSIS — M79604 Pain in right leg: Secondary | ICD-10-CM | POA: Diagnosis not present

## 2017-07-30 DIAGNOSIS — M79671 Pain in right foot: Secondary | ICD-10-CM | POA: Diagnosis not present

## 2017-07-30 DIAGNOSIS — M7121 Synovial cyst of popliteal space [Baker], right knee: Secondary | ICD-10-CM | POA: Diagnosis not present

## 2017-07-30 DIAGNOSIS — R600 Localized edema: Secondary | ICD-10-CM | POA: Diagnosis not present

## 2017-08-03 DIAGNOSIS — M25561 Pain in right knee: Secondary | ICD-10-CM | POA: Diagnosis not present

## 2017-08-19 DIAGNOSIS — E119 Type 2 diabetes mellitus without complications: Secondary | ICD-10-CM | POA: Diagnosis not present

## 2017-08-19 DIAGNOSIS — Z7984 Long term (current) use of oral hypoglycemic drugs: Secondary | ICD-10-CM | POA: Diagnosis not present

## 2017-08-31 DIAGNOSIS — M25561 Pain in right knee: Secondary | ICD-10-CM | POA: Diagnosis not present

## 2017-08-31 DIAGNOSIS — M79671 Pain in right foot: Secondary | ICD-10-CM | POA: Diagnosis not present

## 2017-09-23 DIAGNOSIS — M47817 Spondylosis without myelopathy or radiculopathy, lumbosacral region: Secondary | ICD-10-CM | POA: Diagnosis not present

## 2017-09-23 DIAGNOSIS — G959 Disease of spinal cord, unspecified: Secondary | ICD-10-CM | POA: Diagnosis not present

## 2017-09-24 DIAGNOSIS — M79641 Pain in right hand: Secondary | ICD-10-CM | POA: Diagnosis not present

## 2017-09-24 DIAGNOSIS — M19049 Primary osteoarthritis, unspecified hand: Secondary | ICD-10-CM | POA: Diagnosis not present

## 2017-09-24 DIAGNOSIS — M79642 Pain in left hand: Secondary | ICD-10-CM | POA: Diagnosis not present

## 2017-10-15 DIAGNOSIS — M47817 Spondylosis without myelopathy or radiculopathy, lumbosacral region: Secondary | ICD-10-CM | POA: Diagnosis not present

## 2017-11-11 DIAGNOSIS — Z6841 Body Mass Index (BMI) 40.0 and over, adult: Secondary | ICD-10-CM | POA: Diagnosis not present

## 2017-11-11 DIAGNOSIS — E7800 Pure hypercholesterolemia, unspecified: Secondary | ICD-10-CM | POA: Diagnosis not present

## 2017-11-11 DIAGNOSIS — M5442 Lumbago with sciatica, left side: Secondary | ICD-10-CM | POA: Diagnosis not present

## 2017-11-11 DIAGNOSIS — Z7984 Long term (current) use of oral hypoglycemic drugs: Secondary | ICD-10-CM | POA: Diagnosis not present

## 2017-11-11 DIAGNOSIS — E119 Type 2 diabetes mellitus without complications: Secondary | ICD-10-CM | POA: Diagnosis not present

## 2017-11-11 DIAGNOSIS — M5441 Lumbago with sciatica, right side: Secondary | ICD-10-CM | POA: Diagnosis not present

## 2017-11-11 DIAGNOSIS — I10 Essential (primary) hypertension: Secondary | ICD-10-CM | POA: Diagnosis not present

## 2017-11-11 DIAGNOSIS — F419 Anxiety disorder, unspecified: Secondary | ICD-10-CM | POA: Diagnosis not present

## 2017-12-08 DIAGNOSIS — M47817 Spondylosis without myelopathy or radiculopathy, lumbosacral region: Secondary | ICD-10-CM | POA: Diagnosis not present

## 2017-12-15 DIAGNOSIS — R748 Abnormal levels of other serum enzymes: Secondary | ICD-10-CM | POA: Diagnosis not present

## 2017-12-26 DIAGNOSIS — J208 Acute bronchitis due to other specified organisms: Secondary | ICD-10-CM | POA: Diagnosis not present

## 2017-12-26 DIAGNOSIS — R05 Cough: Secondary | ICD-10-CM | POA: Diagnosis not present

## 2017-12-26 DIAGNOSIS — J0100 Acute maxillary sinusitis, unspecified: Secondary | ICD-10-CM | POA: Diagnosis not present

## 2018-01-25 DIAGNOSIS — D126 Benign neoplasm of colon, unspecified: Secondary | ICD-10-CM | POA: Diagnosis not present

## 2018-01-25 DIAGNOSIS — Z1211 Encounter for screening for malignant neoplasm of colon: Secondary | ICD-10-CM | POA: Diagnosis not present

## 2018-01-25 DIAGNOSIS — K640 First degree hemorrhoids: Secondary | ICD-10-CM | POA: Diagnosis not present

## 2018-01-25 DIAGNOSIS — K635 Polyp of colon: Secondary | ICD-10-CM | POA: Diagnosis not present

## 2018-01-28 DIAGNOSIS — K635 Polyp of colon: Secondary | ICD-10-CM | POA: Diagnosis not present

## 2018-01-28 DIAGNOSIS — D126 Benign neoplasm of colon, unspecified: Secondary | ICD-10-CM | POA: Diagnosis not present

## 2018-02-03 DIAGNOSIS — M5116 Intervertebral disc disorders with radiculopathy, lumbar region: Secondary | ICD-10-CM | POA: Diagnosis not present

## 2018-02-03 DIAGNOSIS — R251 Tremor, unspecified: Secondary | ICD-10-CM | POA: Diagnosis not present

## 2018-02-03 DIAGNOSIS — G959 Disease of spinal cord, unspecified: Secondary | ICD-10-CM | POA: Diagnosis not present

## 2018-02-03 DIAGNOSIS — R2689 Other abnormalities of gait and mobility: Secondary | ICD-10-CM | POA: Diagnosis not present

## 2018-02-10 DIAGNOSIS — E1149 Type 2 diabetes mellitus with other diabetic neurological complication: Secondary | ICD-10-CM | POA: Diagnosis not present

## 2018-02-10 DIAGNOSIS — R630 Anorexia: Secondary | ICD-10-CM | POA: Diagnosis not present

## 2018-02-10 DIAGNOSIS — Z7984 Long term (current) use of oral hypoglycemic drugs: Secondary | ICD-10-CM | POA: Diagnosis not present

## 2018-02-10 DIAGNOSIS — G8929 Other chronic pain: Secondary | ICD-10-CM | POA: Diagnosis not present

## 2018-02-10 DIAGNOSIS — M5441 Lumbago with sciatica, right side: Secondary | ICD-10-CM | POA: Diagnosis not present

## 2018-02-10 DIAGNOSIS — M5442 Lumbago with sciatica, left side: Secondary | ICD-10-CM | POA: Diagnosis not present

## 2018-02-16 DIAGNOSIS — M5116 Intervertebral disc disorders with radiculopathy, lumbar region: Secondary | ICD-10-CM | POA: Diagnosis not present

## 2018-02-16 DIAGNOSIS — M50023 Cervical disc disorder at C6-C7 level with myelopathy: Secondary | ICD-10-CM | POA: Diagnosis not present

## 2018-02-16 DIAGNOSIS — M50021 Cervical disc disorder at C4-C5 level with myelopathy: Secondary | ICD-10-CM | POA: Diagnosis not present

## 2018-02-16 DIAGNOSIS — R251 Tremor, unspecified: Secondary | ICD-10-CM | POA: Diagnosis not present

## 2018-02-16 DIAGNOSIS — M50022 Cervical disc disorder at C5-C6 level with myelopathy: Secondary | ICD-10-CM | POA: Diagnosis not present

## 2018-03-14 DIAGNOSIS — N390 Urinary tract infection, site not specified: Secondary | ICD-10-CM | POA: Diagnosis not present

## 2018-03-17 DIAGNOSIS — R2689 Other abnormalities of gait and mobility: Secondary | ICD-10-CM | POA: Diagnosis not present

## 2018-03-17 DIAGNOSIS — G959 Disease of spinal cord, unspecified: Secondary | ICD-10-CM | POA: Diagnosis not present

## 2018-03-17 DIAGNOSIS — M5116 Intervertebral disc disorders with radiculopathy, lumbar region: Secondary | ICD-10-CM | POA: Diagnosis not present

## 2018-03-22 DIAGNOSIS — M5116 Intervertebral disc disorders with radiculopathy, lumbar region: Secondary | ICD-10-CM | POA: Diagnosis not present

## 2018-03-22 DIAGNOSIS — R2689 Other abnormalities of gait and mobility: Secondary | ICD-10-CM | POA: Diagnosis not present

## 2018-03-22 DIAGNOSIS — G959 Disease of spinal cord, unspecified: Secondary | ICD-10-CM | POA: Diagnosis not present

## 2018-03-25 DIAGNOSIS — R2689 Other abnormalities of gait and mobility: Secondary | ICD-10-CM | POA: Diagnosis not present

## 2018-03-25 DIAGNOSIS — M5116 Intervertebral disc disorders with radiculopathy, lumbar region: Secondary | ICD-10-CM | POA: Diagnosis not present

## 2018-03-25 DIAGNOSIS — G959 Disease of spinal cord, unspecified: Secondary | ICD-10-CM | POA: Diagnosis not present

## 2018-03-31 DIAGNOSIS — G959 Disease of spinal cord, unspecified: Secondary | ICD-10-CM | POA: Diagnosis not present

## 2018-03-31 DIAGNOSIS — M5116 Intervertebral disc disorders with radiculopathy, lumbar region: Secondary | ICD-10-CM | POA: Diagnosis not present

## 2018-03-31 DIAGNOSIS — R2689 Other abnormalities of gait and mobility: Secondary | ICD-10-CM | POA: Diagnosis not present

## 2018-04-06 DIAGNOSIS — G959 Disease of spinal cord, unspecified: Secondary | ICD-10-CM | POA: Diagnosis not present

## 2018-04-06 DIAGNOSIS — M5116 Intervertebral disc disorders with radiculopathy, lumbar region: Secondary | ICD-10-CM | POA: Diagnosis not present

## 2018-04-06 DIAGNOSIS — R2689 Other abnormalities of gait and mobility: Secondary | ICD-10-CM | POA: Diagnosis not present

## 2018-04-08 DIAGNOSIS — R2689 Other abnormalities of gait and mobility: Secondary | ICD-10-CM | POA: Diagnosis not present

## 2018-04-08 DIAGNOSIS — G959 Disease of spinal cord, unspecified: Secondary | ICD-10-CM | POA: Diagnosis not present

## 2018-04-08 DIAGNOSIS — M5116 Intervertebral disc disorders with radiculopathy, lumbar region: Secondary | ICD-10-CM | POA: Diagnosis not present

## 2018-04-11 DIAGNOSIS — M5116 Intervertebral disc disorders with radiculopathy, lumbar region: Secondary | ICD-10-CM | POA: Diagnosis not present

## 2018-04-11 DIAGNOSIS — G959 Disease of spinal cord, unspecified: Secondary | ICD-10-CM | POA: Diagnosis not present

## 2018-04-11 DIAGNOSIS — R2689 Other abnormalities of gait and mobility: Secondary | ICD-10-CM | POA: Diagnosis not present

## 2018-04-15 DIAGNOSIS — R2689 Other abnormalities of gait and mobility: Secondary | ICD-10-CM | POA: Diagnosis not present

## 2018-04-15 DIAGNOSIS — M5116 Intervertebral disc disorders with radiculopathy, lumbar region: Secondary | ICD-10-CM | POA: Diagnosis not present

## 2018-04-15 DIAGNOSIS — G959 Disease of spinal cord, unspecified: Secondary | ICD-10-CM | POA: Diagnosis not present

## 2018-04-18 ENCOUNTER — Other Ambulatory Visit: Payer: Self-pay | Admitting: Family Medicine

## 2018-04-18 DIAGNOSIS — Z1231 Encounter for screening mammogram for malignant neoplasm of breast: Secondary | ICD-10-CM

## 2018-05-16 DIAGNOSIS — E119 Type 2 diabetes mellitus without complications: Secondary | ICD-10-CM | POA: Diagnosis not present

## 2018-05-16 DIAGNOSIS — Z23 Encounter for immunization: Secondary | ICD-10-CM | POA: Diagnosis not present

## 2018-05-16 DIAGNOSIS — F419 Anxiety disorder, unspecified: Secondary | ICD-10-CM | POA: Diagnosis not present

## 2018-05-16 DIAGNOSIS — R159 Full incontinence of feces: Secondary | ICD-10-CM | POA: Diagnosis not present

## 2018-05-16 DIAGNOSIS — Z7984 Long term (current) use of oral hypoglycemic drugs: Secondary | ICD-10-CM | POA: Diagnosis not present

## 2018-05-16 DIAGNOSIS — R197 Diarrhea, unspecified: Secondary | ICD-10-CM | POA: Diagnosis not present

## 2018-05-16 DIAGNOSIS — R109 Unspecified abdominal pain: Secondary | ICD-10-CM | POA: Diagnosis not present

## 2018-05-17 DIAGNOSIS — R197 Diarrhea, unspecified: Secondary | ICD-10-CM | POA: Diagnosis not present

## 2018-05-23 ENCOUNTER — Ambulatory Visit
Admission: RE | Admit: 2018-05-23 | Discharge: 2018-05-23 | Disposition: A | Discharge status: Home / Self Care | Payer: Medicare HMO | Source: Ambulatory Visit | Attending: Family Medicine | Admitting: Family Medicine

## 2018-05-23 DIAGNOSIS — Z1231 Encounter for screening mammogram for malignant neoplasm of breast: Secondary | ICD-10-CM

## 2018-06-20 DIAGNOSIS — G959 Disease of spinal cord, unspecified: Secondary | ICD-10-CM | POA: Diagnosis not present

## 2018-06-20 DIAGNOSIS — M545 Low back pain: Secondary | ICD-10-CM | POA: Diagnosis not present

## 2018-06-20 DIAGNOSIS — M47817 Spondylosis without myelopathy or radiculopathy, lumbosacral region: Secondary | ICD-10-CM | POA: Diagnosis not present

## 2018-07-08 DIAGNOSIS — R300 Dysuria: Secondary | ICD-10-CM | POA: Diagnosis not present

## 2018-07-25 DIAGNOSIS — R05 Cough: Secondary | ICD-10-CM | POA: Diagnosis not present

## 2018-07-27 DIAGNOSIS — N390 Urinary tract infection, site not specified: Secondary | ICD-10-CM | POA: Diagnosis not present

## 2018-07-27 DIAGNOSIS — E139 Other specified diabetes mellitus without complications: Secondary | ICD-10-CM | POA: Diagnosis not present

## 2018-09-01 DIAGNOSIS — Z7984 Long term (current) use of oral hypoglycemic drugs: Secondary | ICD-10-CM | POA: Diagnosis not present

## 2018-09-01 DIAGNOSIS — E119 Type 2 diabetes mellitus without complications: Secondary | ICD-10-CM | POA: Diagnosis not present

## 2018-09-21 DIAGNOSIS — I10 Essential (primary) hypertension: Secondary | ICD-10-CM | POA: Diagnosis not present

## 2018-09-21 DIAGNOSIS — Z23 Encounter for immunization: Secondary | ICD-10-CM | POA: Diagnosis not present

## 2018-09-21 DIAGNOSIS — Z6841 Body Mass Index (BMI) 40.0 and over, adult: Secondary | ICD-10-CM | POA: Diagnosis not present

## 2018-09-21 DIAGNOSIS — E782 Mixed hyperlipidemia: Secondary | ICD-10-CM | POA: Diagnosis not present

## 2018-09-21 DIAGNOSIS — E119 Type 2 diabetes mellitus without complications: Secondary | ICD-10-CM | POA: Diagnosis not present

## 2018-11-07 DIAGNOSIS — G959 Disease of spinal cord, unspecified: Secondary | ICD-10-CM | POA: Diagnosis not present

## 2018-11-07 DIAGNOSIS — M545 Low back pain: Secondary | ICD-10-CM | POA: Diagnosis not present

## 2018-11-07 DIAGNOSIS — R251 Tremor, unspecified: Secondary | ICD-10-CM | POA: Diagnosis not present

## 2018-11-07 DIAGNOSIS — G4700 Insomnia, unspecified: Secondary | ICD-10-CM | POA: Diagnosis not present

## 2018-11-07 DIAGNOSIS — M5116 Intervertebral disc disorders with radiculopathy, lumbar region: Secondary | ICD-10-CM | POA: Diagnosis not present

## 2018-12-08 DIAGNOSIS — E119 Type 2 diabetes mellitus without complications: Secondary | ICD-10-CM | POA: Diagnosis not present

## 2018-12-08 DIAGNOSIS — E782 Mixed hyperlipidemia: Secondary | ICD-10-CM | POA: Diagnosis not present

## 2018-12-21 ENCOUNTER — Emergency Department (HOSPITAL_COMMUNITY): Payer: Medicare HMO

## 2018-12-21 ENCOUNTER — Encounter (HOSPITAL_COMMUNITY): Payer: Self-pay

## 2018-12-21 ENCOUNTER — Emergency Department (HOSPITAL_COMMUNITY)
Admission: EM | Admit: 2018-12-21 | Discharge: 2018-12-21 | Disposition: A | Discharge status: Home / Self Care | Payer: Medicare HMO | Attending: Emergency Medicine | Admitting: Emergency Medicine

## 2018-12-21 DIAGNOSIS — Z7984 Long term (current) use of oral hypoglycemic drugs: Secondary | ICD-10-CM | POA: Insufficient documentation

## 2018-12-21 DIAGNOSIS — E871 Hypo-osmolality and hyponatremia: Secondary | ICD-10-CM

## 2018-12-21 DIAGNOSIS — R079 Chest pain, unspecified: Secondary | ICD-10-CM | POA: Diagnosis not present

## 2018-12-21 DIAGNOSIS — I7090 Unspecified atherosclerosis: Secondary | ICD-10-CM | POA: Diagnosis not present

## 2018-12-21 DIAGNOSIS — I10 Essential (primary) hypertension: Secondary | ICD-10-CM | POA: Diagnosis not present

## 2018-12-21 DIAGNOSIS — Z87891 Personal history of nicotine dependence: Secondary | ICD-10-CM | POA: Diagnosis not present

## 2018-12-21 DIAGNOSIS — R0902 Hypoxemia: Secondary | ICD-10-CM | POA: Diagnosis not present

## 2018-12-21 DIAGNOSIS — J342 Deviated nasal septum: Secondary | ICD-10-CM | POA: Diagnosis not present

## 2018-12-21 DIAGNOSIS — E119 Type 2 diabetes mellitus without complications: Secondary | ICD-10-CM | POA: Diagnosis not present

## 2018-12-21 DIAGNOSIS — I440 Atrioventricular block, first degree: Secondary | ICD-10-CM | POA: Diagnosis not present

## 2018-12-21 DIAGNOSIS — Z7982 Long term (current) use of aspirin: Secondary | ICD-10-CM | POA: Insufficient documentation

## 2018-12-21 DIAGNOSIS — Z79899 Other long term (current) drug therapy: Secondary | ICD-10-CM | POA: Diagnosis not present

## 2018-12-21 DIAGNOSIS — R55 Syncope and collapse: Secondary | ICD-10-CM | POA: Insufficient documentation

## 2018-12-21 LAB — COMPREHENSIVE METABOLIC PANEL
ALT: 21 U/L (ref 0–44)
AST: 30 U/L (ref 15–41)
Albumin: 3.4 g/dL — ABNORMAL LOW (ref 3.5–5.0)
Alkaline Phosphatase: 68 U/L (ref 38–126)
Anion gap: 12 (ref 5–15)
BUN: 15 mg/dL (ref 6–20)
CO2: 22 mmol/L (ref 22–32)
Calcium: 8.8 mg/dL — ABNORMAL LOW (ref 8.9–10.3)
Chloride: 92 mmol/L — ABNORMAL LOW (ref 98–111)
Creatinine, Ser: 0.98 mg/dL (ref 0.44–1.00)
GFR calc Af Amer: >60 mL/min (ref >60)
GFR calc non Af Amer: >60 mL/min (ref >60)
Glucose, Bld: 118 mg/dL — ABNORMAL HIGH (ref 70–99)
Potassium: 4.4 mmol/L (ref 3.5–5.1)
Sodium: 126 mmol/L — ABNORMAL LOW (ref 135–145)
Total Bilirubin: 1.2 mg/dL (ref 0.3–1.2)
Total Protein: 5.9 g/dL — ABNORMAL LOW (ref 6.5–8.1)

## 2018-12-21 LAB — CBC WITH DIFFERENTIAL/PLATELET
Abs Immature Granulocytes: 0.02 10*3/uL (ref 0.00–0.07)
Basophils Absolute: 0 10*3/uL (ref 0.0–0.1)
Basophils Relative: 1 %
Eosinophils Absolute: 0.2 10*3/uL (ref 0.0–0.5)
Eosinophils Relative: 3 %
HCT: 35.2 % — ABNORMAL LOW (ref 36.0–46.0)
Hemoglobin: 12.4 g/dL (ref 12.0–15.0)
Immature Granulocytes: 0 %
Lymphocytes Relative: 34 %
Lymphs Abs: 2.1 10*3/uL (ref 0.7–4.0)
MCH: 32.4 pg (ref 26.0–34.0)
MCHC: 35.2 g/dL (ref 30.0–36.0)
MCV: 91.9 fL (ref 80.0–100.0)
Monocytes Absolute: 0.8 10*3/uL (ref 0.1–1.0)
Monocytes Relative: 12 %
Neutro Abs: 3.1 10*3/uL (ref 1.7–7.7)
Neutrophils Relative %: 50 %
Platelets: 212 10*3/uL (ref 150–400)
RBC: 3.83 MIL/uL — ABNORMAL LOW (ref 3.87–5.11)
RDW: 11.5 % (ref 11.5–15.5)
WBC: 6.2 10*3/uL (ref 4.0–10.5)
nRBC: 0 % (ref 0.0–0.2)

## 2018-12-21 LAB — URINALYSIS, ROUTINE W REFLEX MICROSCOPIC
Glucose, UA: NEGATIVE mg/dL
Hgb urine dipstick: NEGATIVE
Ketones, ur: NEGATIVE mg/dL
Leukocytes,Ua: NEGATIVE
Nitrite: NEGATIVE
Protein, ur: NEGATIVE mg/dL
Specific Gravity, Urine: 1.023 (ref 1.005–1.030)
pH: 5 (ref 5.0–8.0)

## 2018-12-21 LAB — BRAIN NATRIURETIC PEPTIDE: B Natriuretic Peptide: 54.2 pg/mL (ref 0.0–100.0)

## 2018-12-21 LAB — TROPONIN I: Troponin I: <0.03 ng/mL (ref <0.03)

## 2018-12-21 MED ORDER — SODIUM CHLORIDE 0.9 % IV BOLUS
1000.0000 mL | Freq: Once | INTRAVENOUS | Status: AC
  Administered 2018-12-21: 1000 mL via INTRAVENOUS

## 2018-12-21 NOTE — ED Notes (Signed)
Patient was able to ambulate with minimal assistance and no complaints.

## 2018-12-21 NOTE — ED Notes (Signed)
ED Provider at bedside. 

## 2018-12-21 NOTE — ED Triage Notes (Signed)
Pt comes via [facility] from home, has not been feeling well for the past 3 days, per family pt was unresponsive on the toilet, became more responsive upon lying down, and then had another syncopal episode upon sitting up. Pt also had substernal CP during transport that has now resolved.

## 2018-12-21 NOTE — ED Notes (Signed)
Daughter, Theadora [phone] for updates

## 2018-12-21 NOTE — ED Provider Notes (Signed)
[facility] EMERGENCY DEPARTMENT Provider Note   CSN: [id number] Arrival date & time: 12/21/18  0350    History   Chief Complaint Chief Complaint  Patient presents with  . Loss of Consciousness    HPI [name] is a 58 y.o. female.     Patient is a 58 year old female with past medical history of hypertension, hyperlipidemia, diabetes.  She presents today for evaluation of syncope.  According to the patient and family, she has "passed out" multiple times this evening.  I am told that she had an unresponsive episode while sitting on the toilet.  She then laid down on the floor and began to feel better.  When she attempted to sit up, she then had another syncopal spell.  Patient describes to me that she has not felt well for the past 3 days, but denies any specific aches or pains.  She denies any fevers or chills.  The history is provided by the patient.  Loss of Consciousness  Episode history:  Multiple Most recent episode:  Today Timing:  Intermittent Chronicity:  New Witnessed: yes   Relieved by:  Nothing Worsened by:  Nothing Ineffective treatments:  None tried   Past Medical History:  Diagnosis Date  . Aortic valve calcification    thickened  . Arthritis   . Chronic back pain    stenosis  . Depression    takes Lexapro daily  . Diabetes mellitus ([place])   . Headache   . History of bronchitis 2 yrs ago  . History of colon polyps    benign  . Hyperlipidemia   . Hypertension    takes Lisinopril and HCTZ daily  . Insomnia    takes Xanax at bedtime  . Murmur, cardiac   . Nocturia   . OSA on CPAP   . PONV (postoperative nausea and vomiting)   . Urinary frequency    takes Ditropan nightly  . Urinary urgency   . Vertigo    hx of that required Meclizine   . Weakness    numbness and tingling in both legs and occasionally in the left    Patient Active Problem List   Diagnosis Date Noted  . Abnormal nuclear stress test   . Spinal  stenosis, lumbar region, with neurogenic claudication 03/04/2015    Past Surgical History:  Procedure Laterality Date  . ABDOMINAL HYSTERECTOMY  1998  . BACK SURGERY    . bladder tack     x 2   . BREAST CYST ASPIRATION Right   . CARDIAC CATHETERIZATION N/A 06/23/2016   Procedure: Left Heart Cath and Coronary Angiography;  Surgeon: Belva Crome, MD;  Location: [facility];  Service: Cardiovascular;  Laterality: N/A;  . CERVICAL FUSION    . CHOLECYSTECTOMY    . COLONOSCOPY       OB History   No obstetric history on file.      Home Medications    Prior to Admission medications   Medication Sig Start Date End Date Taking? Authorizing Provider  acetaminophen (TYLENOL) 500 MG tablet Take 500 mg by mouth every 6 (six) hours as needed for mild pain or headache.     [provider]  acetaminophen-codeine (TYLENOL #3) 300-30 MG tablet Take 1 tablet by mouth every 4 (four) hours as needed for moderate pain.  05/31/16   [provider]  ALPRAZolam Duanne Moron) 0.5 MG tablet Take 0.5 mg by mouth at bedtime.    [provider]  amitriptyline (  ELAVIL) 25 MG tablet Take 25 mg by mouth at bedtime.    [provider]  aspirin 81 MG tablet Take 81 mg by mouth daily.    [provider]  atorvastatin (LIPITOR) 10 MG tablet Take 10 mg by mouth daily.    [provider]  carbamazepine (TEGRETOL) 200 MG tablet Take 400 mg by mouth 3 (three) times daily.     [provider]  cholecalciferol (VITAMIN D) 1000 UNITS tablet Take 1,000 Units by mouth daily.    [provider]  escitalopram (LEXAPRO) 10 MG tablet Take 20 mg by mouth daily.    [provider]  fish oil-omega-3 fatty acids 1000 MG capsule Take 1 g by mouth daily.     [provider]  lisinopril-hydrochlorothiazide (PRINZIDE,ZESTORETIC) 10-12.5 MG tablet Take 1 tablet by mouth daily.    [provider]  metFORMIN (GLUCOPHAGE-XR) 500 MG 24 hr  tablet Take 500 mg by mouth daily with breakfast.    [provider]  metoprolol tartrate (LOPRESSOR) 25 MG tablet Take 1 tablet (25 mg total) by mouth 2 (two) times daily. 08/11/16 11/09/16  Barrett, Evelene Croon, PA-C  tolterodine (DETROL LA) 4 MG 24 hr capsule Take 4 mg by mouth daily.    [provider]    Family History Family History  Problem Relation Age of Onset  . Hypertension Father   . Cerebrovascular Disease Father   . Bladder Cancer Father   . Colon cancer Mother   . Hypertension Mother   . Hyperlipidemia Mother   . Atrial fibrillation Mother   . Kidney cancer Brother   . Hypertension Brother   . Diabetes Brother   . Diabetes Brother   . Cerebrovascular Disease Brother   . Atrial fibrillation Brother   . Hypertension Sister   . Diabetes Sister   . Sleep apnea Other        siblings  . Breast cancer Neg Hx     Social History Social History   Tobacco Use  . Smoking status: Former Research scientist (life sciences)  . Smokeless tobacco: Never Used  . Tobacco comment: quit smoking 20+yrs ago  Substance Use Topics  . Alcohol use: No  . Drug use: No     Allergies   Patient has no known allergies.   Review of Systems Review of Systems  Cardiovascular: Positive for syncope.  All other systems reviewed and are negative.    Physical Exam Updated Vital Signs BP 123/71   Pulse (!) 53   Temp (!) 97.4 F (36.3 C) (Oral)   Resp 12   SpO2 99%   Physical Exam Vitals signs and nursing note reviewed.  Constitutional:      General: She is not in acute distress.    Appearance: She is well-developed. She is not diaphoretic.  HENT:     Head: Normocephalic and atraumatic.  Neck:     Musculoskeletal: Normal range of motion and neck supple.  Cardiovascular:     Rate and Rhythm: Normal rate and regular rhythm.     Heart sounds: No murmur. No friction rub. No gallop.   Pulmonary:     Effort: Pulmonary effort is normal. No respiratory distress.     Breath sounds: Normal  breath sounds. No wheezing.  Abdominal:     General: Bowel sounds are normal. There is no distension.     Palpations: Abdomen is soft.     Tenderness: There is no abdominal tenderness.  Musculoskeletal: Normal range of motion.  Skin:  General: Skin is warm and dry.  Neurological:     General: No focal deficit present.     Mental Status: She is alert and oriented to person, place, and time.     Cranial Nerves: No cranial nerve deficit.     Motor: No weakness.     Coordination: Coordination normal.      ED Treatments / Results  Labs (all labs ordered are listed, but only abnormal results are displayed) Labs Reviewed  COMPREHENSIVE METABOLIC PANEL  CBC WITH DIFFERENTIAL/PLATELET  TROPONIN I  BRAIN NATRIURETIC PEPTIDE  URINALYSIS, ROUTINE W REFLEX MICROSCOPIC    EKG EKG Interpretation  Date/Time:  Wednesday December 21 2018 04:07:16 EDT Ventricular Rate:  53 PR Interval:    QRS Duration: 99 QT Interval:  443 QTC Calculation: 416 R Axis:   98 Text Interpretation:  Sinus rhythm Prolonged PR interval Inferior infarct, old Confirmed by Veryl Speak [phone]) on 12/21/2018 4:19:57 AM   Radiology No results found.  Procedures Procedures (including critical care time)  Medications Ordered in ED Medications  sodium chloride 0.9 % bolus 1,000 mL (has no administration in time range)     Initial Impression / Assessment and Plan / ED Course  I have reviewed the triage vital signs and the nursing notes.  Pertinent labs & imaging results that were available during my care of the patient were reviewed by me and considered in my medical decision making (see chart for details).  Patient presenting with complaints of episodes of syncope that occurred at home this evening.  The etiology of these episodes I am uncertain.  Patient is not orthostatic and work-up is thus far unremarkable.  Head CT is negative and neurologic exam is nonfocal.  Patient does have a somewhat low sodium level  of 126.  She was given normal saline.  She is now ambulatory and feels improved.  Patient will undergo CT scan of the head.  If this study is unremarkable, I feel as though discharge is appropriate.  Care will be signed out to Dr. Rex Kras at shift change.  She will obtain the results of the CT scan and determine the final disposition.  Final Clinical Impressions(s) / ED Diagnoses   Final diagnoses:  None    ED Discharge Orders    None       Veryl Speak, MD 12/22/18 Benancio Deeds

## 2018-12-21 NOTE — ED Notes (Signed)
Husband- Audry Pili, [phone] for updates- primary contact

## 2018-12-21 NOTE — Discharge Instructions (Signed)
Drink a modest amount of water during the day. You may add some salt to your diet for the next few days, such as in broths/soups and soy sauce. Follow up with your primary care provider for reassessment of your symptoms and recheck of sodium level. Return to ER if any severe worsening of symptoms or new symptoms such as confusion, breathing problems, recurrent passing out, or chest pain.

## 2018-12-21 NOTE — ED Notes (Signed)
Pt understands that she cannot drive until the syncope at different times issue is cleared up

## 2018-12-21 NOTE — ED Provider Notes (Signed)
Received this patient in signout from Dr. Stark Jock.  Briefly, she had presented with episode of syncope as well as not feeling well for the past 3 days without specific other symptoms.  At time of signout, her work-up had been notable for hyponatremia at 126.  EKG and cardiac labs reassuring.  We were awaiting head CT results.  Head CT negative for acute process.  Patient comfortable and mentating normally on reassessment with stable vital signs.  Discussed supportive measures for hyponatremia and emphasized the importance of close PCP follow-up for reassessment of her symptoms and recheck of sodium.  I have extensively reviewed return precautions with her regarding any worsening symptoms or new symptoms such as confusion, recurrent syncope, chest pain, or other concerns.  She voiced understanding.   Nikoli Nasser, Wenda Overland, MD 12/21/18 [phone]

## 2018-12-23 DIAGNOSIS — I351 Nonrheumatic aortic (valve) insufficiency: Secondary | ICD-10-CM | POA: Diagnosis not present

## 2018-12-23 DIAGNOSIS — I10 Essential (primary) hypertension: Secondary | ICD-10-CM | POA: Diagnosis not present

## 2018-12-23 DIAGNOSIS — Z09 Encounter for follow-up examination after completed treatment for conditions other than malignant neoplasm: Secondary | ICD-10-CM | POA: Diagnosis not present

## 2018-12-23 DIAGNOSIS — E782 Mixed hyperlipidemia: Secondary | ICD-10-CM | POA: Diagnosis not present

## 2018-12-23 DIAGNOSIS — R55 Syncope and collapse: Secondary | ICD-10-CM | POA: Diagnosis not present

## 2018-12-23 DIAGNOSIS — E119 Type 2 diabetes mellitus without complications: Secondary | ICD-10-CM | POA: Diagnosis not present

## 2019-01-05 DIAGNOSIS — I080 Rheumatic disorders of both mitral and aortic valves: Secondary | ICD-10-CM | POA: Diagnosis not present

## 2019-01-05 DIAGNOSIS — I517 Cardiomegaly: Secondary | ICD-10-CM | POA: Diagnosis not present

## 2019-01-17 DIAGNOSIS — R55 Syncope and collapse: Secondary | ICD-10-CM | POA: Diagnosis not present

## 2019-01-17 DIAGNOSIS — I110 Hypertensive heart disease with heart failure: Secondary | ICD-10-CM | POA: Diagnosis not present

## 2019-01-17 DIAGNOSIS — I10 Essential (primary) hypertension: Secondary | ICD-10-CM | POA: Diagnosis not present

## 2019-01-17 DIAGNOSIS — G4733 Obstructive sleep apnea (adult) (pediatric): Secondary | ICD-10-CM | POA: Diagnosis not present

## 2019-01-19 DIAGNOSIS — F5104 Psychophysiologic insomnia: Secondary | ICD-10-CM | POA: Diagnosis not present

## 2019-01-19 DIAGNOSIS — Z9989 Dependence on other enabling machines and devices: Secondary | ICD-10-CM | POA: Diagnosis not present

## 2019-01-19 DIAGNOSIS — G4733 Obstructive sleep apnea (adult) (pediatric): Secondary | ICD-10-CM | POA: Diagnosis not present

## 2019-01-20 DIAGNOSIS — I10 Essential (primary) hypertension: Secondary | ICD-10-CM | POA: Diagnosis not present

## 2019-01-20 DIAGNOSIS — R55 Syncope and collapse: Secondary | ICD-10-CM | POA: Diagnosis not present

## 2019-01-24 DIAGNOSIS — I10 Essential (primary) hypertension: Secondary | ICD-10-CM | POA: Diagnosis not present

## 2019-01-24 DIAGNOSIS — R55 Syncope and collapse: Secondary | ICD-10-CM | POA: Diagnosis not present

## 2019-02-03 DIAGNOSIS — R55 Syncope and collapse: Secondary | ICD-10-CM | POA: Diagnosis not present

## 2019-02-03 DIAGNOSIS — I493 Ventricular premature depolarization: Secondary | ICD-10-CM | POA: Diagnosis not present

## 2019-02-03 DIAGNOSIS — I10 Essential (primary) hypertension: Secondary | ICD-10-CM | POA: Diagnosis not present

## 2019-02-09 DIAGNOSIS — R471 Dysarthria and anarthria: Secondary | ICD-10-CM | POA: Diagnosis not present

## 2019-02-09 DIAGNOSIS — R4781 Slurred speech: Secondary | ICD-10-CM | POA: Diagnosis not present

## 2019-02-09 DIAGNOSIS — R51 Headache: Secondary | ICD-10-CM | POA: Diagnosis not present

## 2019-02-09 DIAGNOSIS — R29818 Other symptoms and signs involving the nervous system: Secondary | ICD-10-CM | POA: Diagnosis not present

## 2019-02-09 DIAGNOSIS — G629 Polyneuropathy, unspecified: Secondary | ICD-10-CM | POA: Diagnosis not present

## 2019-02-09 DIAGNOSIS — Z79899 Other long term (current) drug therapy: Secondary | ICD-10-CM | POA: Diagnosis not present

## 2019-02-09 DIAGNOSIS — F419 Anxiety disorder, unspecified: Secondary | ICD-10-CM | POA: Diagnosis not present

## 2019-02-09 DIAGNOSIS — R410 Disorientation, unspecified: Secondary | ICD-10-CM | POA: Diagnosis not present

## 2019-02-10 DIAGNOSIS — R29818 Other symptoms and signs involving the nervous system: Secondary | ICD-10-CM | POA: Diagnosis not present

## 2019-02-15 DIAGNOSIS — M5116 Intervertebral disc disorders with radiculopathy, lumbar region: Secondary | ICD-10-CM | POA: Diagnosis not present

## 2019-02-15 DIAGNOSIS — G959 Disease of spinal cord, unspecified: Secondary | ICD-10-CM | POA: Diagnosis not present

## 2019-02-15 DIAGNOSIS — R251 Tremor, unspecified: Secondary | ICD-10-CM | POA: Diagnosis not present

## 2019-02-15 DIAGNOSIS — R55 Syncope and collapse: Secondary | ICD-10-CM | POA: Diagnosis not present

## 2019-02-15 DIAGNOSIS — G4700 Insomnia, unspecified: Secondary | ICD-10-CM | POA: Diagnosis not present

## 2019-02-15 DIAGNOSIS — G459 Transient cerebral ischemic attack, unspecified: Secondary | ICD-10-CM | POA: Diagnosis not present

## 2019-02-18 DIAGNOSIS — Z79899 Other long term (current) drug therapy: Secondary | ICD-10-CM | POA: Diagnosis not present

## 2019-02-18 DIAGNOSIS — G459 Transient cerebral ischemic attack, unspecified: Secondary | ICD-10-CM | POA: Diagnosis not present

## 2019-02-20 DIAGNOSIS — G4733 Obstructive sleep apnea (adult) (pediatric): Secondary | ICD-10-CM | POA: Diagnosis not present

## 2019-03-23 DIAGNOSIS — G4733 Obstructive sleep apnea (adult) (pediatric): Secondary | ICD-10-CM | POA: Diagnosis not present

## 2019-04-22 DIAGNOSIS — G4733 Obstructive sleep apnea (adult) (pediatric): Secondary | ICD-10-CM | POA: Diagnosis not present

## 2019-05-15 DIAGNOSIS — F5104 Psychophysiologic insomnia: Secondary | ICD-10-CM | POA: Diagnosis not present

## 2019-05-15 DIAGNOSIS — G4733 Obstructive sleep apnea (adult) (pediatric): Secondary | ICD-10-CM | POA: Diagnosis not present

## 2019-05-15 DIAGNOSIS — Z9989 Dependence on other enabling machines and devices: Secondary | ICD-10-CM | POA: Diagnosis not present

## 2019-05-16 ENCOUNTER — Other Ambulatory Visit: Payer: Self-pay | Admitting: Family Medicine

## 2019-05-16 DIAGNOSIS — Z1231 Encounter for screening mammogram for malignant neoplasm of breast: Secondary | ICD-10-CM

## 2019-05-17 DIAGNOSIS — M47816 Spondylosis without myelopathy or radiculopathy, lumbar region: Secondary | ICD-10-CM | POA: Diagnosis not present

## 2019-05-17 DIAGNOSIS — M47812 Spondylosis without myelopathy or radiculopathy, cervical region: Secondary | ICD-10-CM | POA: Diagnosis not present

## 2019-05-17 DIAGNOSIS — M5136 Other intervertebral disc degeneration, lumbar region: Secondary | ICD-10-CM | POA: Diagnosis not present

## 2019-05-17 DIAGNOSIS — I10 Essential (primary) hypertension: Secondary | ICD-10-CM | POA: Diagnosis not present

## 2019-05-17 DIAGNOSIS — M4722 Other spondylosis with radiculopathy, cervical region: Secondary | ICD-10-CM | POA: Diagnosis not present

## 2019-05-17 DIAGNOSIS — M5030 Other cervical disc degeneration, unspecified cervical region: Secondary | ICD-10-CM | POA: Diagnosis not present

## 2019-05-17 DIAGNOSIS — E1142 Type 2 diabetes mellitus with diabetic polyneuropathy: Secondary | ICD-10-CM | POA: Diagnosis not present

## 2019-05-17 DIAGNOSIS — M48061 Spinal stenosis, lumbar region without neurogenic claudication: Secondary | ICD-10-CM | POA: Diagnosis not present

## 2019-05-17 DIAGNOSIS — M170 Bilateral primary osteoarthritis of knee: Secondary | ICD-10-CM | POA: Diagnosis not present

## 2019-05-17 DIAGNOSIS — M9971 Connective tissue and disc stenosis of intervertebral foramina of cervical region: Secondary | ICD-10-CM | POA: Diagnosis not present

## 2019-05-23 DIAGNOSIS — G4733 Obstructive sleep apnea (adult) (pediatric): Secondary | ICD-10-CM | POA: Diagnosis not present

## 2019-05-24 DIAGNOSIS — I10 Essential (primary) hypertension: Secondary | ICD-10-CM | POA: Diagnosis not present

## 2019-05-24 DIAGNOSIS — E119 Type 2 diabetes mellitus without complications: Secondary | ICD-10-CM | POA: Diagnosis not present

## 2019-05-24 DIAGNOSIS — Z23 Encounter for immunization: Secondary | ICD-10-CM | POA: Diagnosis not present

## 2019-05-24 DIAGNOSIS — E7800 Pure hypercholesterolemia, unspecified: Secondary | ICD-10-CM | POA: Diagnosis not present

## 2019-05-24 DIAGNOSIS — E669 Obesity, unspecified: Secondary | ICD-10-CM | POA: Diagnosis not present

## 2019-05-26 DIAGNOSIS — G4733 Obstructive sleep apnea (adult) (pediatric): Secondary | ICD-10-CM | POA: Diagnosis not present

## 2019-05-30 DIAGNOSIS — M47816 Spondylosis without myelopathy or radiculopathy, lumbar region: Secondary | ICD-10-CM | POA: Diagnosis not present

## 2019-06-07 DIAGNOSIS — F39 Unspecified mood [affective] disorder: Secondary | ICD-10-CM | POA: Diagnosis not present

## 2019-06-07 DIAGNOSIS — G459 Transient cerebral ischemic attack, unspecified: Secondary | ICD-10-CM | POA: Diagnosis not present

## 2019-06-07 DIAGNOSIS — M5116 Intervertebral disc disorders with radiculopathy, lumbar region: Secondary | ICD-10-CM | POA: Diagnosis not present

## 2019-06-07 DIAGNOSIS — G959 Disease of spinal cord, unspecified: Secondary | ICD-10-CM | POA: Diagnosis not present

## 2019-06-13 DIAGNOSIS — F419 Anxiety disorder, unspecified: Secondary | ICD-10-CM | POA: Diagnosis not present

## 2019-06-14 DIAGNOSIS — G894 Chronic pain syndrome: Secondary | ICD-10-CM | POA: Diagnosis not present

## 2019-06-14 DIAGNOSIS — M47816 Spondylosis without myelopathy or radiculopathy, lumbar region: Secondary | ICD-10-CM | POA: Diagnosis not present

## 2019-06-14 DIAGNOSIS — I10 Essential (primary) hypertension: Secondary | ICD-10-CM | POA: Diagnosis not present

## 2019-06-14 DIAGNOSIS — M47814 Spondylosis without myelopathy or radiculopathy, thoracic region: Secondary | ICD-10-CM | POA: Diagnosis not present

## 2019-06-14 DIAGNOSIS — M545 Low back pain: Secondary | ICD-10-CM | POA: Diagnosis not present

## 2019-06-22 DIAGNOSIS — G4733 Obstructive sleep apnea (adult) (pediatric): Secondary | ICD-10-CM | POA: Diagnosis not present

## 2019-07-12 ENCOUNTER — Ambulatory Visit
Admission: RE | Admit: 2019-07-12 | Discharge: 2019-07-12 | Disposition: A | Discharge status: Home / Self Care | Payer: Medicare HMO | Source: Ambulatory Visit | Attending: Family Medicine | Admitting: Family Medicine

## 2019-07-12 ENCOUNTER — Other Ambulatory Visit: Payer: Self-pay

## 2019-07-12 DIAGNOSIS — Z1231 Encounter for screening mammogram for malignant neoplasm of breast: Secondary | ICD-10-CM

## 2019-07-23 DIAGNOSIS — G4733 Obstructive sleep apnea (adult) (pediatric): Secondary | ICD-10-CM | POA: Diagnosis not present

## 2020-05-31 ENCOUNTER — Other Ambulatory Visit: Payer: Self-pay | Admitting: Family Medicine

## 2020-05-31 DIAGNOSIS — Z1231 Encounter for screening mammogram for malignant neoplasm of breast: Secondary | ICD-10-CM

## 2020-07-18 ENCOUNTER — Ambulatory Visit: Payer: Medicare HMO

## 2020-07-27 IMAGING — DX PORTABLE CHEST - 1 VIEW
1 series · 1 of 1 positions shown · non-contrast
Comparison: CT chest 07/09/2015

CLINICAL DATA: Weakness. Not feeling well for 3 days. Unresponsive.
Chest pain.

EXAM:
PORTABLE CHEST 1 VIEW

[chest ap]
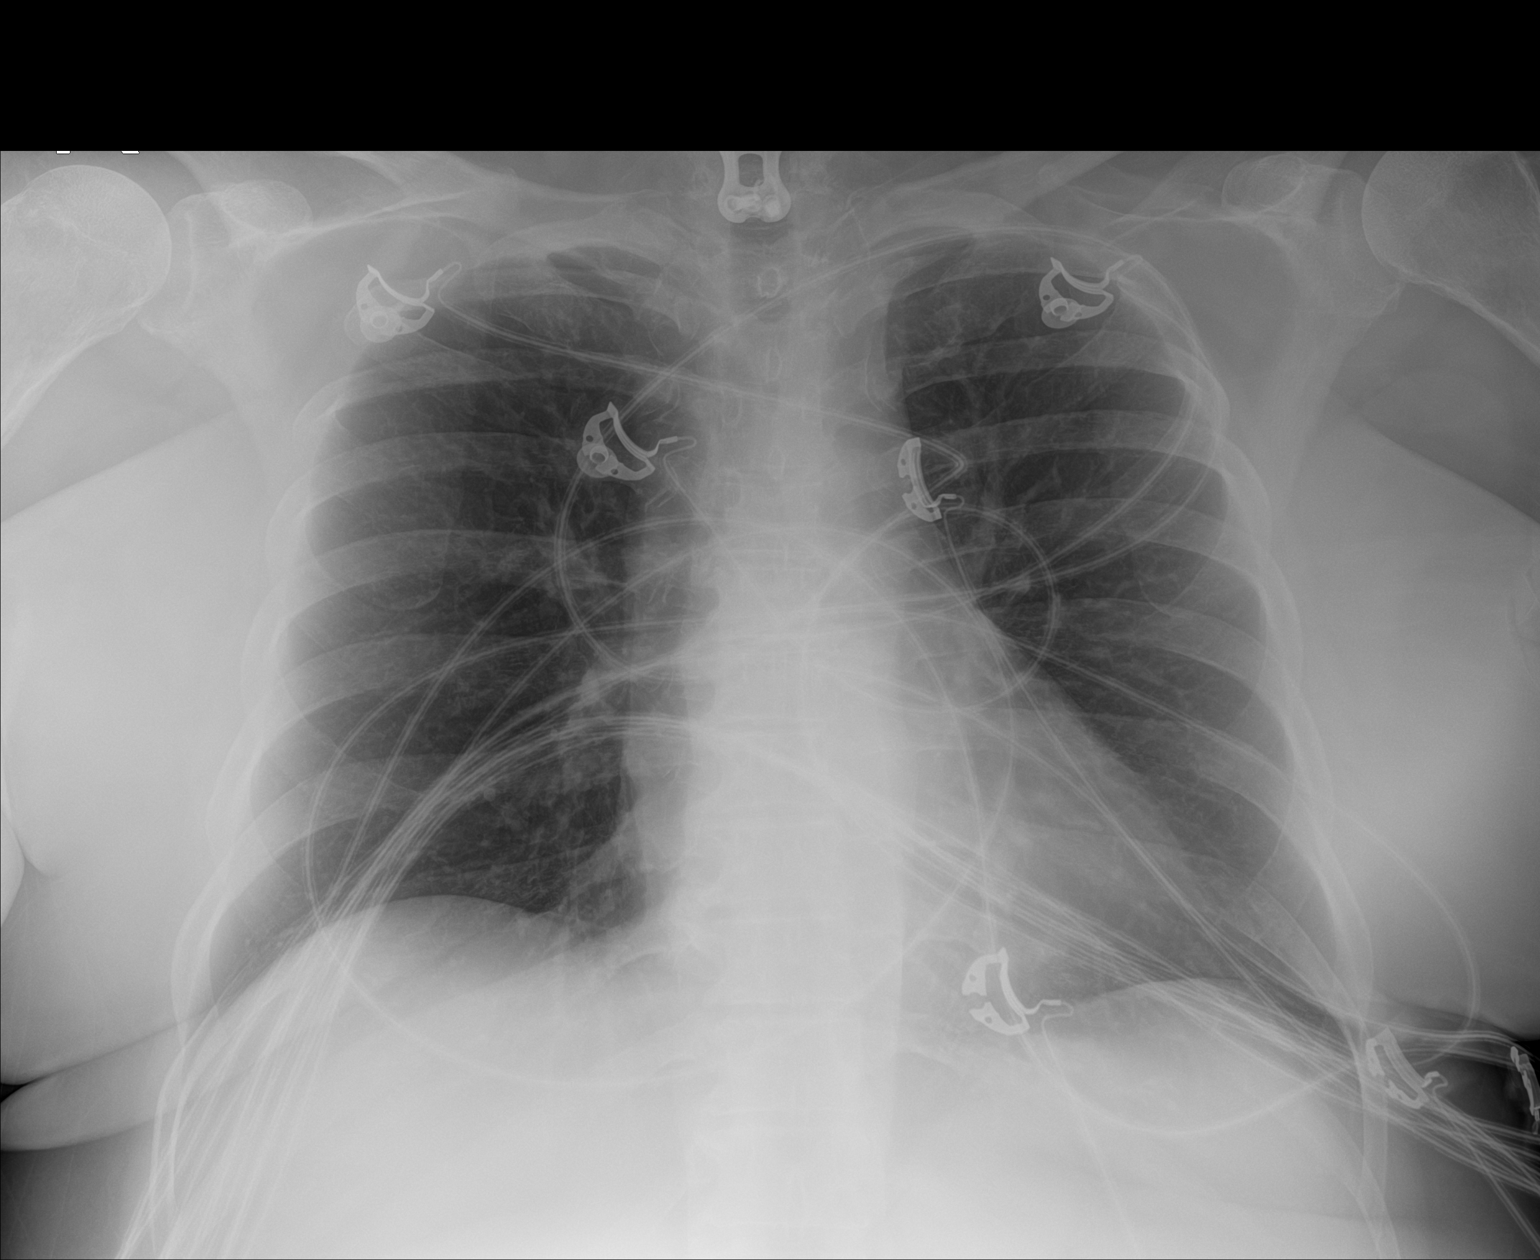

[1 of 1 positions shown; findings below may reference images not displayed]

FINDINGS: The heart size is exaggerated by low lung volumes. There is no edema
or effusion. No focal airspace disease is present. Cervical spine
fusion is noted. The visualized soft tissues and bony thorax are
otherwise unremarkable.
IMPRESSION: 1. Low lung volumes.
2. No acute cardiopulmonary disease.

## 2020-07-27 IMAGING — CT CT HEAD WITHOUT CONTRAST
3 of 4 series · 13 of 47 positions shown, 15 images · non-contrast
Comparison: None.

CLINICAL DATA: Syncope

EXAM:
CT HEAD WITHOUT CONTRAST
TECHNIQUE: Contiguous axial images were obtained from the base of the skull
through the vertex without intravenous contrast.

[Series 3: head without · axial · non-contrast · 0.49mm/px · z∈[-150,-14]mm · 7 of 37 slices shown, 9 images]
[im 5/37  brain]
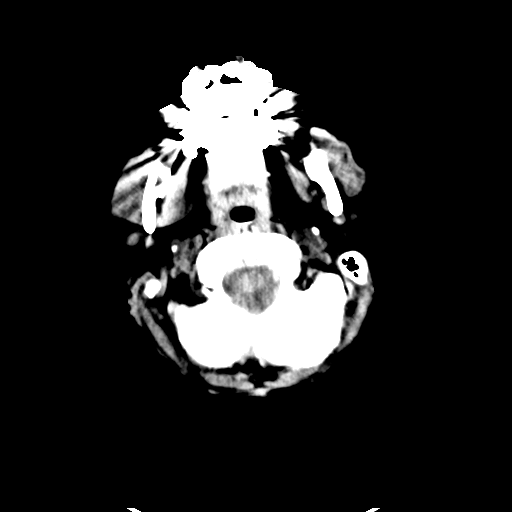
[im 5/37  bone]
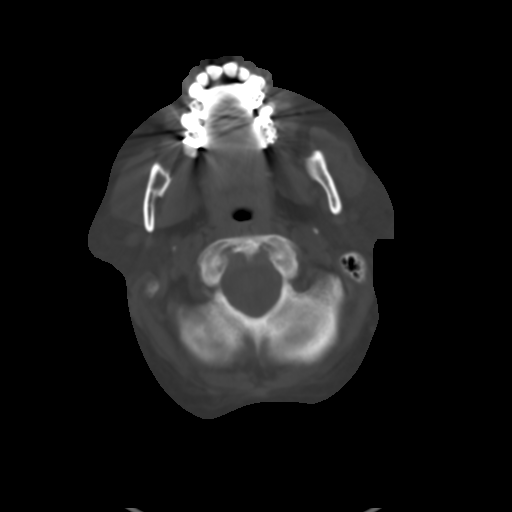
[im 10/37  brain]
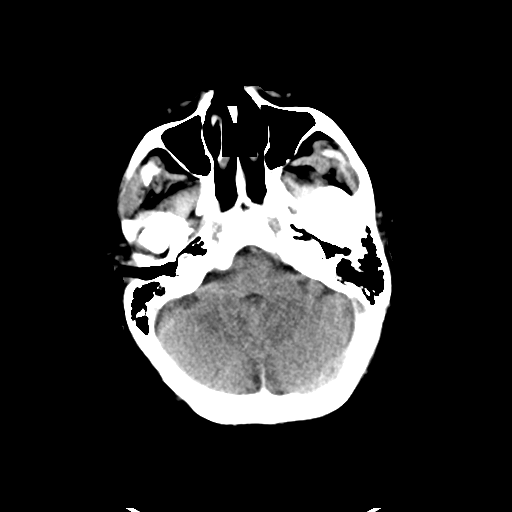
[im 14/37  brain]
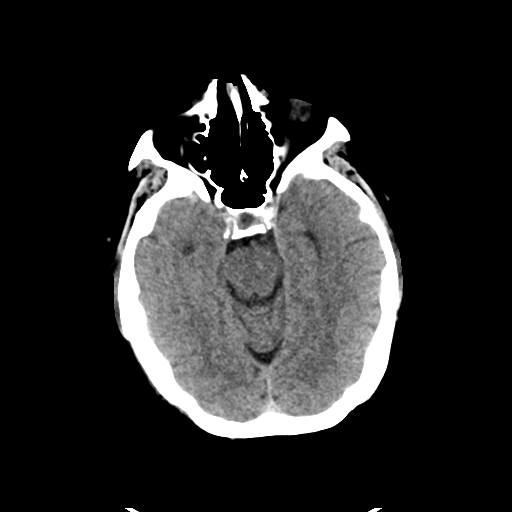
[im 19/37  brain]
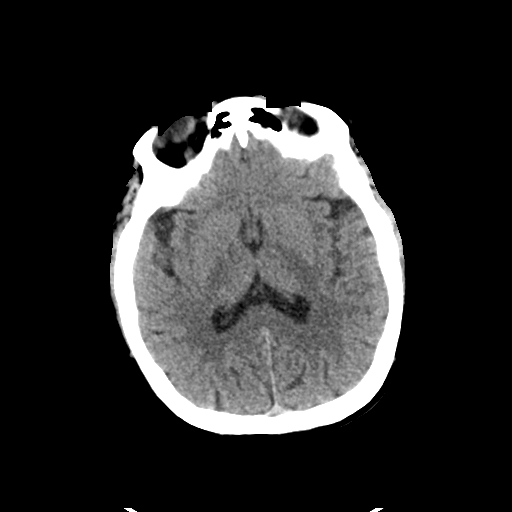
[im 23/37  brain]
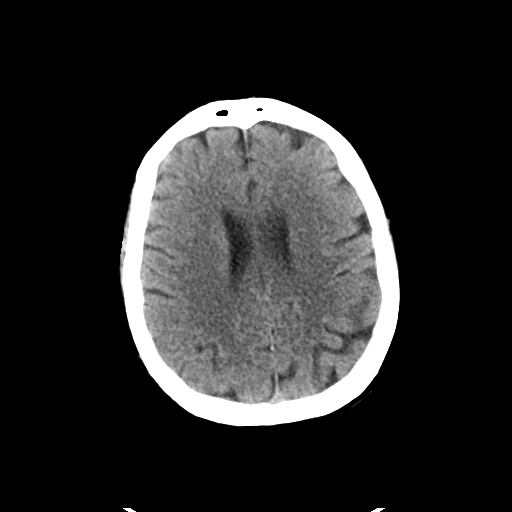
[im 23/37  bone]
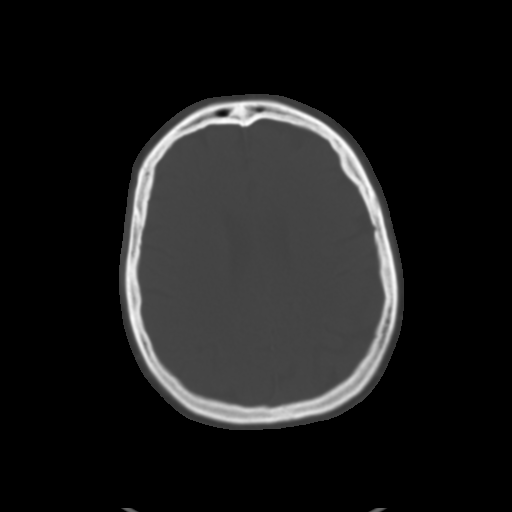
[im 28/37  brain]
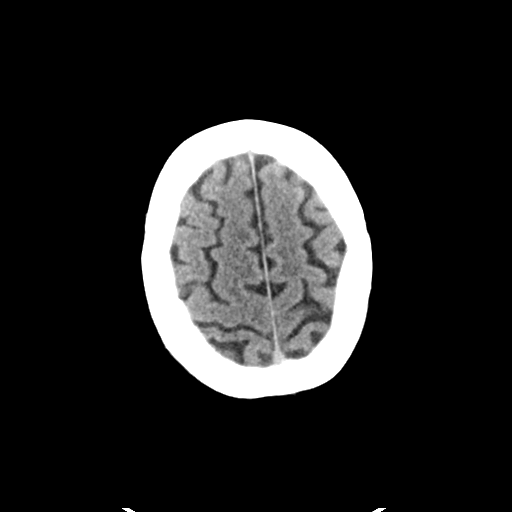
[im 32/37  brain]
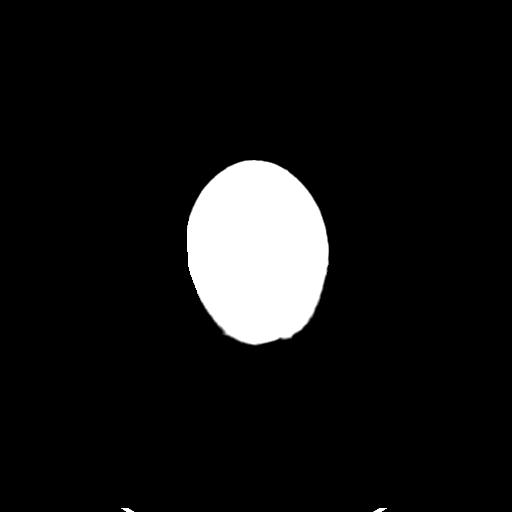

[Series 5: head without cor · coronal · non-contrast · 0.35mm/px · 3 of 84 slices shown]
[im 28/84  brain]
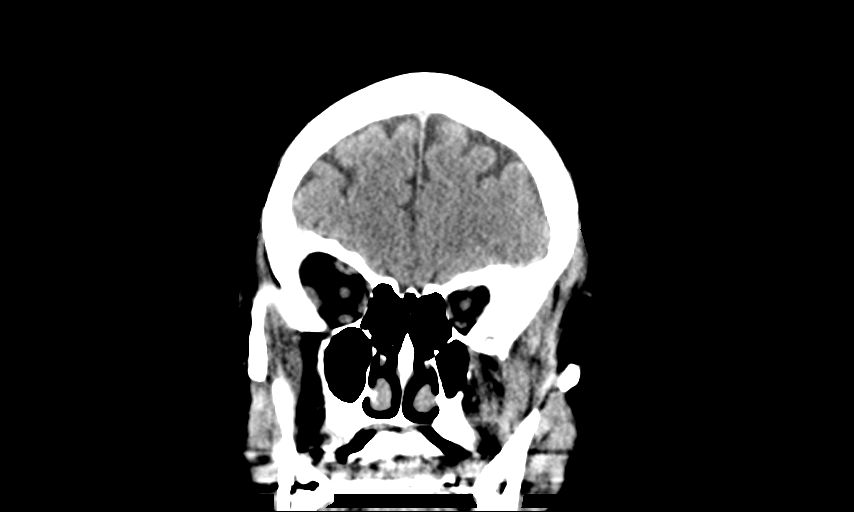
[im 37/84  brain]
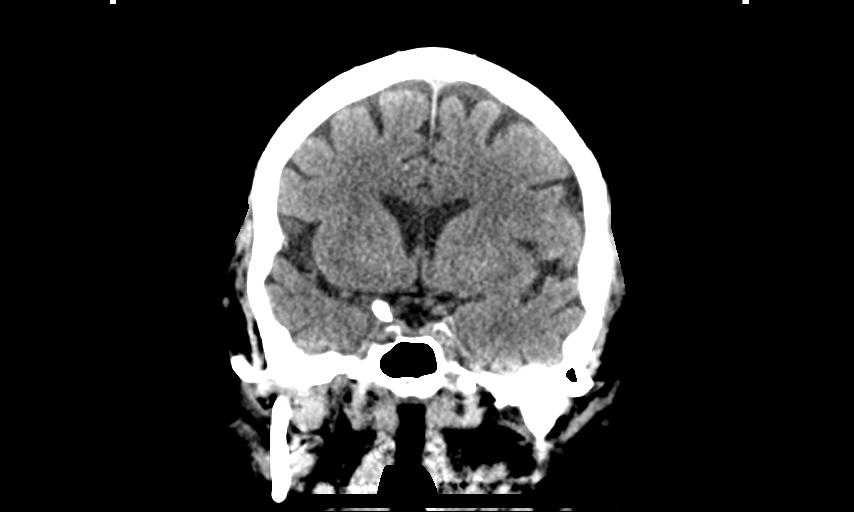
[im 47/84  brain]
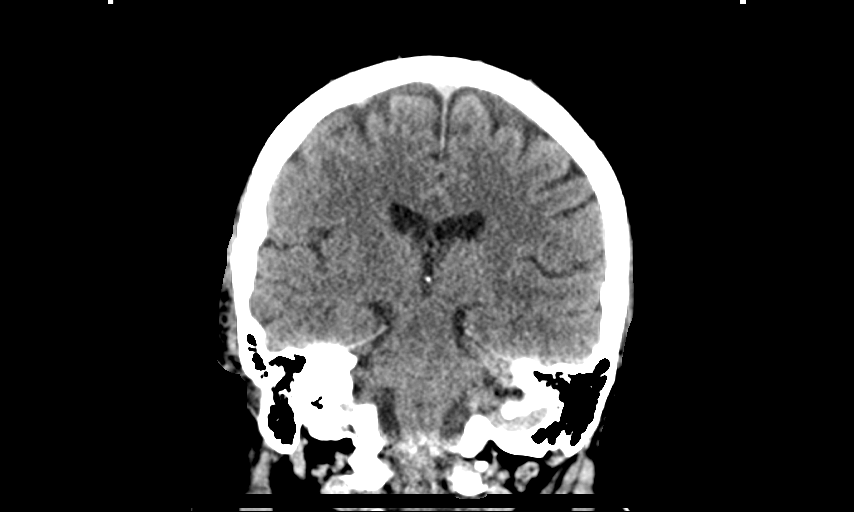

[Series 6: head without sag · sagittal · non-contrast · 0.35mm/px · 3 of 67 slices shown]
[im 23/67  brain]
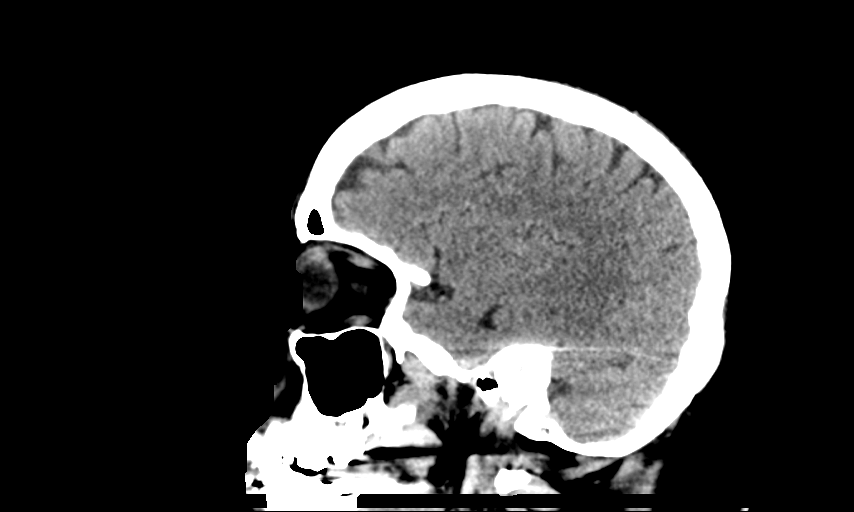
[im 34/67  brain]
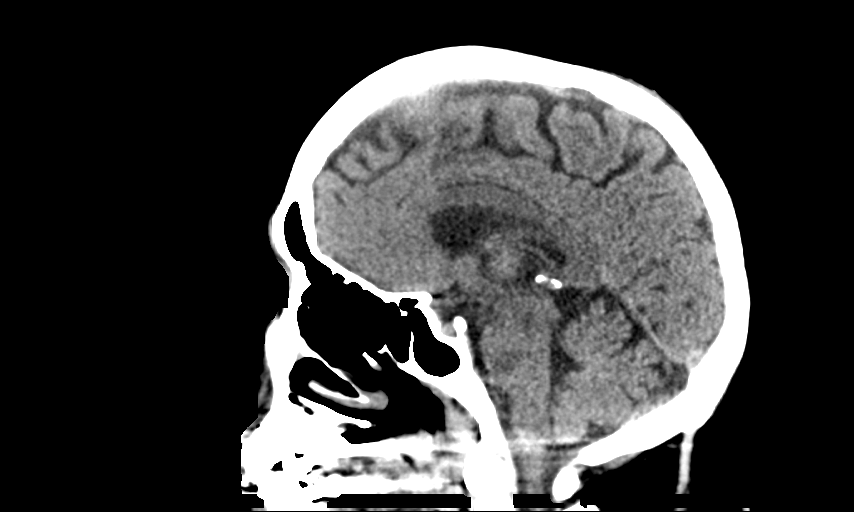
[im 45/67  brain]
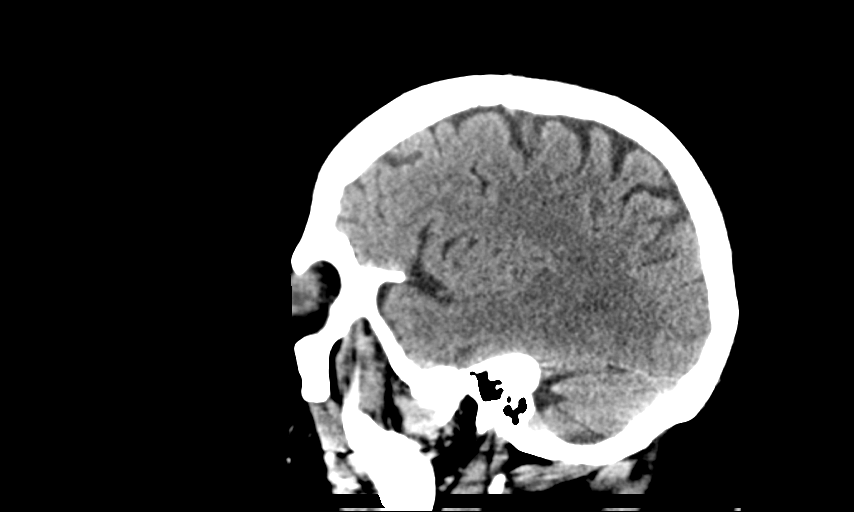

[13 of 47 positions shown; findings below may reference images not displayed]

FINDINGS: Brain: The ventricles are normal in size and configuration. There is
no intracranial mass, hemorrhage, extra-axial fluid collection, or
midline shift. Brain parenchyma appears unremarkable. No evident
acute infarct.

Vascular: No hyperdense vessels. There is calcification in each
carotid siphon region.

Skull: The bony calvarium appears intact.

Sinuses/Orbits: Visualized paranasal sinuses are clear. There is
mild leftward deviation of the nasal septum. Orbits appear symmetric
bilaterally.

Other: Mastoid air cells are clear.
IMPRESSION: There are foci of arterial vascular calcification. There is nasal
septal deviation. Study otherwise unremarkable.

## 2020-08-05 DIAGNOSIS — U071 COVID-19: Secondary | ICD-10-CM | POA: Diagnosis not present

## 2020-08-06 ENCOUNTER — Other Ambulatory Visit: Payer: Self-pay | Admitting: Physician Assistant

## 2020-08-06 DIAGNOSIS — I10 Essential (primary) hypertension: Secondary | ICD-10-CM

## 2020-08-06 DIAGNOSIS — U071 COVID-19: Secondary | ICD-10-CM

## 2020-08-06 DIAGNOSIS — E118 Type 2 diabetes mellitus with unspecified complications: Secondary | ICD-10-CM

## 2020-08-06 NOTE — Progress Notes (Signed)
I connected by phone with [name] on 08/06/2020 at 4:58 PM to discuss the potential use of a new treatment for mild to moderate COVID-19 viral infection in non-hospitalized patients.  This patient is a 60 y.o. female that meets the FDA criteria for Emergency Use Authorization of COVID monoclonal antibody sotrovimab.  Has a (+) direct SARS-CoV-2 viral test result  Has mild or moderate COVID-19   Is NOT hospitalized due to COVID-19  Is within 10 days of symptom onset  Has at least one of the high risk factor(s) for progression to severe COVID-19 and/or hospitalization as defined in EUA.  Specific high risk criteria : BMI > 25, Diabetes, Cardiovascular disease or hypertension and Other high risk medical condition per CDC:  high SVI, unvaccinated.    I have spoken and communicated the following to the patient or parent/caregiver regarding COVID monoclonal antibody treatment:  1. FDA has authorized the emergency use for the treatment of mild to moderate COVID-19 in adults and pediatric patients with positive results of direct SARS-CoV-2 viral testing who are 56 years of age and older weighing at least 40 kg, and who are at high risk for progressing to severe COVID-19 and/or hospitalization.  2. The significant known and potential risks and benefits of COVID monoclonal antibody, and the extent to which such potential risks and benefits are unknown.  3. Information on available alternative treatments and the risks and benefits of those alternatives, including clinical trials.  4. Patients treated with COVID monoclonal antibody should continue to self-isolate and use infection control measures (e.g., wear mask, isolate, social distance, avoid sharing personal items, clean and disinfect "high touch" surfaces, and frequent handwashing) according to CDC guidelines.   5. The patient or parent/caregiver has the option to accept or refuse COVID monoclonal antibody treatment.  After  reviewing this information with the patient, the patient has agreed to receive one of the available covid 19 monoclonal antibodies and will be provided an appropriate fact sheet prior to infusion.   Sx onset 1/22. Unvaccinated. Set up for infusion 1/26 @ 10:30am. Aware insurance will be charged an infusion fee.   Angelena Form, PA-C 08/06/2020 4:58 PM

## 2020-08-07 ENCOUNTER — Telehealth: Payer: Self-pay | Admitting: Family

## 2020-08-07 ENCOUNTER — Other Ambulatory Visit (HOSPITAL_COMMUNITY): Payer: Self-pay

## 2020-08-07 ENCOUNTER — Ambulatory Visit (HOSPITAL_COMMUNITY)
Admission: RE | Admit: 2020-08-07 | Discharge: 2020-08-07 | Disposition: A | Discharge status: Home / Self Care | Payer: Medicare Other | Source: Ambulatory Visit | Attending: Pulmonary Disease | Admitting: Pulmonary Disease

## 2020-08-07 DIAGNOSIS — I10 Essential (primary) hypertension: Secondary | ICD-10-CM | POA: Diagnosis not present

## 2020-08-07 DIAGNOSIS — U071 COVID-19: Secondary | ICD-10-CM | POA: Diagnosis not present

## 2020-08-07 DIAGNOSIS — E118 Type 2 diabetes mellitus with unspecified complications: Secondary | ICD-10-CM | POA: Diagnosis not present

## 2020-08-07 MED ORDER — DIPHENHYDRAMINE HCL 50 MG/ML IJ SOLN: 50.0000 mg | Freq: Once | INTRAMUSCULAR | Status: DC | PRN

## 2020-08-07 MED ORDER — FAMOTIDINE IN NACL 20-0.9 MG/50ML-% IV SOLN: 20.0000 mg | Freq: Once | INTRAVENOUS | Status: DC | PRN

## 2020-08-07 MED ORDER — EPINEPHRINE 0.3 MG/0.3ML IJ SOAJ: 0.3000 mg | Freq: Once | INTRAMUSCULAR | Status: DC | PRN

## 2020-08-07 MED ORDER — METHYLPREDNISOLONE SODIUM SUCC 125 MG IJ SOLR: 125.0000 mg | Freq: Once | INTRAMUSCULAR | Status: DC | PRN

## 2020-08-07 MED ORDER — SOTROVIMAB 500 MG/8ML IV SOLN
500.0000 mg | Freq: Once | INTRAVENOUS | Status: AC
  Administered 2020-08-07: 500 mg via INTRAVENOUS

## 2020-08-07 MED ORDER — SODIUM CHLORIDE 0.9 % IV SOLN: INTRAVENOUS | Status: DC | PRN

## 2020-08-07 MED ORDER — ALBUTEROL SULFATE HFA 108 (90 BASE) MCG/ACT IN AERS: 2.0000 | INHALATION_SPRAY | Freq: Once | RESPIRATORY_TRACT | Status: DC | PRN

## 2020-08-07 NOTE — Telephone Encounter (Signed)
Miss [name] received MAB infusion today. Received after hours call to MAB-on-call line from her husband. Tells me that when she laid down to go to sleep tonight her nose was very congested which made her feel short of breath and anxious. We discussed that this was likely due to her COVID19 infection and not due to the MAB received today. Tells me she has had head congestion and a stuffy nose since having COVID.   She last took Theraflu this morning. She took Fluticasone nasal spray today. She also took Muccinex around dinner time.   Encouraged to take a hot shower with steam to help loosen congestion. Recommended she take a dose of Theraflu. Recommend adding an extra pillow tonight for comfort.   I could hear [name] on the call as well discussing with her husband and she was not audibly short of breath nor wheezing. They were appreciative of the recommendations and agreeable to the plan.   Loel Dubonnet, NP

## 2020-08-07 NOTE — Progress Notes (Signed)
Patient reviewed Fact Sheet for Patients, Parents, and Caregivers for Emergency Use Authorization (EUA) of sotrovimab for the Treatment of Coronavirus. Patient also reviewed and is agreeable to the estimated cost of treatment. Patient is agreeable to proceed.   

## 2020-08-07 NOTE — Progress Notes (Signed)
Diagnosis: COVID-19  Physician: Dr. Patrick Wright  Procedure: [facility] Infusion [facility] Med: Sotrovimab infusion - Provided patient with sotrovimab fact sheet for patients, parents, and caregivers prior to infusion.   Complications: No immediate complications noted  Discharge: Discharged home    

## 2020-08-07 NOTE — Discharge Instructions (Signed)

## 2020-08-23 ENCOUNTER — Ambulatory Visit: Payer: Medicare Other

## 2020-10-07 ENCOUNTER — Ambulatory Visit
Admission: RE | Admit: 2020-10-07 | Discharge: 2020-10-07 | Disposition: A | Discharge status: Home / Self Care | Payer: Medicare Other | Source: Ambulatory Visit | Attending: Family Medicine | Admitting: Family Medicine

## 2020-10-07 ENCOUNTER — Other Ambulatory Visit: Payer: Self-pay

## 2020-10-07 DIAGNOSIS — Z1231 Encounter for screening mammogram for malignant neoplasm of breast: Secondary | ICD-10-CM | POA: Diagnosis not present

## 2020-10-10 DIAGNOSIS — M19049 Primary osteoarthritis, unspecified hand: Secondary | ICD-10-CM | POA: Diagnosis not present

## 2020-10-10 DIAGNOSIS — E782 Mixed hyperlipidemia: Secondary | ICD-10-CM | POA: Diagnosis not present

## 2020-10-10 DIAGNOSIS — E1149 Type 2 diabetes mellitus with other diabetic neurological complication: Secondary | ICD-10-CM | POA: Diagnosis not present

## 2020-10-10 DIAGNOSIS — G4700 Insomnia, unspecified: Secondary | ICD-10-CM | POA: Diagnosis not present

## 2020-10-10 DIAGNOSIS — E7800 Pure hypercholesterolemia, unspecified: Secondary | ICD-10-CM | POA: Diagnosis not present

## 2020-10-10 DIAGNOSIS — I10 Essential (primary) hypertension: Secondary | ICD-10-CM | POA: Diagnosis not present

## 2020-10-10 DIAGNOSIS — G8929 Other chronic pain: Secondary | ICD-10-CM | POA: Diagnosis not present

## 2020-10-10 DIAGNOSIS — E139 Other specified diabetes mellitus without complications: Secondary | ICD-10-CM | POA: Diagnosis not present

## 2020-11-04 DIAGNOSIS — E7800 Pure hypercholesterolemia, unspecified: Secondary | ICD-10-CM | POA: Diagnosis not present

## 2020-11-04 DIAGNOSIS — E1169 Type 2 diabetes mellitus with other specified complication: Secondary | ICD-10-CM | POA: Diagnosis not present

## 2020-11-04 DIAGNOSIS — I10 Essential (primary) hypertension: Secondary | ICD-10-CM | POA: Diagnosis not present

## 2020-11-04 DIAGNOSIS — G4700 Insomnia, unspecified: Secondary | ICD-10-CM | POA: Diagnosis not present

## 2020-11-04 DIAGNOSIS — M19049 Primary osteoarthritis, unspecified hand: Secondary | ICD-10-CM | POA: Diagnosis not present

## 2020-11-04 DIAGNOSIS — E782 Mixed hyperlipidemia: Secondary | ICD-10-CM | POA: Diagnosis not present

## 2020-11-04 DIAGNOSIS — E1149 Type 2 diabetes mellitus with other diabetic neurological complication: Secondary | ICD-10-CM | POA: Diagnosis not present

## 2020-11-04 DIAGNOSIS — G8929 Other chronic pain: Secondary | ICD-10-CM | POA: Diagnosis not present

## 2020-11-04 DIAGNOSIS — E139 Other specified diabetes mellitus without complications: Secondary | ICD-10-CM | POA: Diagnosis not present

## 2020-11-05 DIAGNOSIS — E782 Mixed hyperlipidemia: Secondary | ICD-10-CM | POA: Diagnosis not present

## 2020-11-05 DIAGNOSIS — E1149 Type 2 diabetes mellitus with other diabetic neurological complication: Secondary | ICD-10-CM | POA: Diagnosis not present

## 2020-11-05 DIAGNOSIS — G4700 Insomnia, unspecified: Secondary | ICD-10-CM | POA: Diagnosis not present

## 2020-11-05 DIAGNOSIS — Z7984 Long term (current) use of oral hypoglycemic drugs: Secondary | ICD-10-CM | POA: Diagnosis not present

## 2020-11-05 DIAGNOSIS — I10 Essential (primary) hypertension: Secondary | ICD-10-CM | POA: Diagnosis not present

## 2020-11-05 DIAGNOSIS — G8929 Other chronic pain: Secondary | ICD-10-CM | POA: Diagnosis not present

## 2020-11-05 DIAGNOSIS — E1169 Type 2 diabetes mellitus with other specified complication: Secondary | ICD-10-CM | POA: Diagnosis not present

## 2020-12-05 DIAGNOSIS — Z7984 Long term (current) use of oral hypoglycemic drugs: Secondary | ICD-10-CM | POA: Diagnosis not present

## 2020-12-05 DIAGNOSIS — E119 Type 2 diabetes mellitus without complications: Secondary | ICD-10-CM | POA: Diagnosis not present

## 2020-12-05 DIAGNOSIS — S0501XA Injury of conjunctiva and corneal abrasion without foreign body, right eye, initial encounter: Secondary | ICD-10-CM | POA: Diagnosis not present

## 2020-12-07 DIAGNOSIS — G8929 Other chronic pain: Secondary | ICD-10-CM | POA: Diagnosis not present

## 2020-12-07 DIAGNOSIS — M19049 Primary osteoarthritis, unspecified hand: Secondary | ICD-10-CM | POA: Diagnosis not present

## 2020-12-07 DIAGNOSIS — E7800 Pure hypercholesterolemia, unspecified: Secondary | ICD-10-CM | POA: Diagnosis not present

## 2020-12-07 DIAGNOSIS — G4700 Insomnia, unspecified: Secondary | ICD-10-CM | POA: Diagnosis not present

## 2020-12-07 DIAGNOSIS — I10 Essential (primary) hypertension: Secondary | ICD-10-CM | POA: Diagnosis not present

## 2020-12-07 DIAGNOSIS — E782 Mixed hyperlipidemia: Secondary | ICD-10-CM | POA: Diagnosis not present

## 2020-12-07 DIAGNOSIS — E139 Other specified diabetes mellitus without complications: Secondary | ICD-10-CM | POA: Diagnosis not present

## 2020-12-19 DIAGNOSIS — Z8673 Personal history of transient ischemic attack (TIA), and cerebral infarction without residual deficits: Secondary | ICD-10-CM | POA: Diagnosis not present

## 2020-12-19 DIAGNOSIS — G959 Disease of spinal cord, unspecified: Secondary | ICD-10-CM | POA: Diagnosis not present

## 2020-12-19 DIAGNOSIS — M545 Low back pain, unspecified: Secondary | ICD-10-CM | POA: Diagnosis not present

## 2020-12-19 DIAGNOSIS — S46811A Strain of other muscles, fascia and tendons at shoulder and upper arm level, right arm, initial encounter: Secondary | ICD-10-CM | POA: Diagnosis not present

## 2021-01-02 DIAGNOSIS — E1169 Type 2 diabetes mellitus with other specified complication: Secondary | ICD-10-CM | POA: Diagnosis not present

## 2021-01-02 DIAGNOSIS — I10 Essential (primary) hypertension: Secondary | ICD-10-CM | POA: Diagnosis not present

## 2021-01-02 DIAGNOSIS — E1149 Type 2 diabetes mellitus with other diabetic neurological complication: Secondary | ICD-10-CM | POA: Diagnosis not present

## 2021-01-02 DIAGNOSIS — G8929 Other chronic pain: Secondary | ICD-10-CM | POA: Diagnosis not present

## 2021-01-02 DIAGNOSIS — E782 Mixed hyperlipidemia: Secondary | ICD-10-CM | POA: Diagnosis not present

## 2021-01-02 DIAGNOSIS — G4700 Insomnia, unspecified: Secondary | ICD-10-CM | POA: Diagnosis not present

## 2021-01-02 DIAGNOSIS — M19049 Primary osteoarthritis, unspecified hand: Secondary | ICD-10-CM | POA: Diagnosis not present

## 2021-01-02 DIAGNOSIS — E139 Other specified diabetes mellitus without complications: Secondary | ICD-10-CM | POA: Diagnosis not present

## 2021-02-02 ENCOUNTER — Other Ambulatory Visit: Payer: Self-pay

## 2021-02-02 ENCOUNTER — Encounter (HOSPITAL_BASED_OUTPATIENT_CLINIC_OR_DEPARTMENT_OTHER): Payer: Self-pay | Admitting: Emergency Medicine

## 2021-02-02 ENCOUNTER — Emergency Department (HOSPITAL_BASED_OUTPATIENT_CLINIC_OR_DEPARTMENT_OTHER): Payer: Medicare Other

## 2021-02-02 ENCOUNTER — Emergency Department (HOSPITAL_BASED_OUTPATIENT_CLINIC_OR_DEPARTMENT_OTHER)
Admission: EM | Admit: 2021-02-02 | Discharge: 2021-02-02 | Disposition: A | Discharge status: Home / Self Care | Payer: Medicare Other | Attending: Emergency Medicine | Admitting: Emergency Medicine

## 2021-02-02 DIAGNOSIS — Z20822 Contact with and (suspected) exposure to covid-19: Secondary | ICD-10-CM | POA: Insufficient documentation

## 2021-02-02 DIAGNOSIS — E119 Type 2 diabetes mellitus without complications: Secondary | ICD-10-CM | POA: Insufficient documentation

## 2021-02-02 DIAGNOSIS — R1010 Upper abdominal pain, unspecified: Secondary | ICD-10-CM | POA: Diagnosis present

## 2021-02-02 DIAGNOSIS — R079 Chest pain, unspecified: Secondary | ICD-10-CM

## 2021-02-02 DIAGNOSIS — Z7984 Long term (current) use of oral hypoglycemic drugs: Secondary | ICD-10-CM | POA: Diagnosis not present

## 2021-02-02 DIAGNOSIS — I10 Essential (primary) hypertension: Secondary | ICD-10-CM | POA: Diagnosis not present

## 2021-02-02 DIAGNOSIS — Z79899 Other long term (current) drug therapy: Secondary | ICD-10-CM | POA: Diagnosis not present

## 2021-02-02 DIAGNOSIS — Z87891 Personal history of nicotine dependence: Secondary | ICD-10-CM | POA: Insufficient documentation

## 2021-02-02 DIAGNOSIS — Z7982 Long term (current) use of aspirin: Secondary | ICD-10-CM | POA: Diagnosis not present

## 2021-02-02 DIAGNOSIS — R0789 Other chest pain: Secondary | ICD-10-CM | POA: Diagnosis not present

## 2021-02-02 DIAGNOSIS — K219 Gastro-esophageal reflux disease without esophagitis: Secondary | ICD-10-CM | POA: Diagnosis not present

## 2021-02-02 LAB — CBC
HCT: 38.4 % (ref 36.0–46.0)
Hemoglobin: 13 g/dL (ref 12.0–15.0)
MCH: 32.7 pg (ref 26.0–34.0)
MCHC: 33.9 g/dL (ref 30.0–36.0)
MCV: 96.5 fL (ref 80.0–100.0)
Platelets: 241 10*3/uL (ref 150–400)
RBC: 3.98 MIL/uL (ref 3.87–5.11)
RDW: 12.1 % (ref 11.5–15.5)
WBC: 6.5 10*3/uL (ref 4.0–10.5)
nRBC: 0 % (ref 0.0–0.2)

## 2021-02-02 LAB — TROPONIN I (HIGH SENSITIVITY)
Troponin I (High Sensitivity): 6 ng/L (ref <18)
Troponin I (High Sensitivity): 6 ng/L (ref <18)

## 2021-02-02 LAB — PROTIME-INR
INR: 1 (ref 0.8–1.2)
Prothrombin Time: 12.7 seconds (ref 11.4–15.2)

## 2021-02-02 LAB — HEPATIC FUNCTION PANEL
ALT: 27 U/L (ref 0–44)
AST: 26 U/L (ref 15–41)
Albumin: 4 g/dL (ref 3.5–5.0)
Alkaline Phosphatase: 85 U/L (ref 38–126)
Bilirubin, Direct: 0.2 mg/dL (ref 0.0–0.2)
Indirect Bilirubin: 0.2 mg/dL — ABNORMAL LOW (ref 0.3–0.9)
Total Bilirubin: 0.4 mg/dL (ref 0.3–1.2)
Total Protein: 7.4 g/dL (ref 6.5–8.1)

## 2021-02-02 LAB — RESP PANEL BY RT-PCR (FLU A&B, COVID) ARPGX2
Influenza A by PCR: NEGATIVE
Influenza B by PCR: NEGATIVE
SARS Coronavirus 2 by RT PCR: NEGATIVE

## 2021-02-02 LAB — BASIC METABOLIC PANEL
Anion gap: 11 (ref 5–15)
BUN: 17 mg/dL (ref 6–20)
CO2: 28 mmol/L (ref 22–32)
Calcium: 9.7 mg/dL (ref 8.9–10.3)
Chloride: 98 mmol/L (ref 98–111)
Creatinine, Ser: 0.82 mg/dL (ref 0.44–1.00)
GFR, Estimated: >60 mL/min (ref >60)
Glucose, Bld: 125 mg/dL — ABNORMAL HIGH (ref 70–99)
Potassium: 4.5 mmol/L (ref 3.5–5.1)
Sodium: 137 mmol/L (ref 135–145)

## 2021-02-02 LAB — LIPASE, BLOOD: Lipase: 27 U/L (ref 11–51)

## 2021-02-02 MED ORDER — OMEPRAZOLE 20 MG PO CPDR: 20.0000 mg | DELAYED_RELEASE_CAPSULE | Freq: Every day | ORAL | 0 refills | Status: DC

## 2021-02-02 MED ORDER — SUCRALFATE 1 GM/10ML PO SUSP
1.0000 g | Freq: Once | ORAL | Status: AC
  Administered 2021-02-02: 1 g via ORAL
  Filled 2021-02-02: qty 10

## 2021-02-02 MED ORDER — FAMOTIDINE IN NACL 20-0.9 MG/50ML-% IV SOLN
20.0000 mg | Freq: Once | INTRAVENOUS | Status: AC
  Administered 2021-02-02: 20 mg via INTRAVENOUS
  Filled 2021-02-02: qty 50

## 2021-02-02 MED ORDER — MORPHINE SULFATE (PF) 4 MG/ML IV SOLN
4.0000 mg | Freq: Once | INTRAVENOUS | Status: AC
  Administered 2021-02-02: 4 mg via INTRAVENOUS
  Filled 2021-02-02: qty 1

## 2021-02-02 MED ORDER — LIDOCAINE VISCOUS HCL 2 % MT SOLN
15.0000 mL | Freq: Once | OROMUCOSAL | Status: AC
  Administered 2021-02-02: 15 mL via ORAL
  Filled 2021-02-02: qty 15

## 2021-02-02 MED ORDER — ALUM & MAG HYDROXIDE-SIMETH 200-200-20 MG/5ML PO SUSP
30.0000 mL | Freq: Once | ORAL | Status: AC
  Administered 2021-02-02: 30 mL via ORAL
  Filled 2021-02-02: qty 30

## 2021-02-02 MED ORDER — ONDANSETRON HCL 4 MG/2ML IJ SOLN
4.0000 mg | Freq: Once | INTRAMUSCULAR | Status: AC
  Administered 2021-02-02: 4 mg via INTRAVENOUS
  Filled 2021-02-02: qty 2

## 2021-02-02 NOTE — ED Provider Notes (Signed)
[facility] Provider Note   CSN: [id number] Arrival date & time: 02/02/21  1154     History Chief Complaint  Patient presents with   Chest Pain    [name] is a 60 y.o. female.  She is here with a complaint of burning upper abdominal pain chest pain associated with shortness of breath nausea feeling fatigued.  She has tried nothing for it.  No leg swelling no urinary symptoms.  Chronic diarrhea.  Non-smoker.  The history is provided by the patient.  Chest Pain Pain location:  Substernal area and epigastric Pain quality: burning   Pain radiates to:  Mid back Pain severity:  Moderate Onset quality:  Gradual Duration:  3 days Timing:  Constant Progression:  Unchanged Chronicity:  New Relieved by:  None tried Worsened by:  Nothing Ineffective treatments:  None tried Associated symptoms: abdominal pain, back pain, heartburn, nausea and shortness of breath   Associated symptoms: no cough, no diaphoresis, no fever, no headache and no vomiting   Risk factors: diabetes mellitus, high cholesterol and hypertension   Risk factors: no smoking       Past Medical History:  Diagnosis Date   Aortic valve calcification    thickened   Arthritis    Chronic back pain    stenosis   Depression    takes Lexapro daily   Diabetes mellitus (HCC)    Headache    History of bronchitis 2 yrs ago   History of colon polyps    benign   Hyperlipidemia    Hypertension    takes Lisinopril and HCTZ daily   Insomnia    takes Xanax at bedtime   Murmur, cardiac    Nocturia    OSA on CPAP    PONV (postoperative nausea and vomiting)    Urinary frequency    takes Ditropan nightly   Urinary urgency    Vertigo    hx of that required Meclizine    Weakness    numbness and tingling in both legs and occasionally in the left    Patient Active Problem List   Diagnosis Date Noted   Abnormal nuclear stress test    Spinal stenosis, lumbar region, with  neurogenic claudication 03/04/2015    Past Surgical History:  Procedure Laterality Date   ABDOMINAL HYSTERECTOMY  1998   BACK SURGERY     bladder tack     x 2    BREAST CYST ASPIRATION Right    CARDIAC CATHETERIZATION N/A 06/23/2016   Procedure: Left Heart Cath and Coronary Angiography;  Surgeon: Belva Crome, MD;  Location: [facility];  Service: Cardiovascular;  Laterality: N/A;   CERVICAL FUSION     CHOLECYSTECTOMY     COLONOSCOPY       OB History   No obstetric history on file.     Family History  Problem Relation Age of Onset   Hypertension Father    Cerebrovascular Disease Father    Bladder Cancer Father    Colon cancer Mother    Hypertension Mother    Hyperlipidemia Mother    Atrial fibrillation Mother    Kidney cancer Brother    Hypertension Brother    Diabetes Brother    Diabetes Brother    Cerebrovascular Disease Brother    Atrial fibrillation Brother    Hypertension Sister    Diabetes Sister    Sleep apnea Other        siblings   Breast cancer Neg Hx  Social History   Tobacco Use   Smoking status: Former   Smokeless tobacco: Never   Tobacco comments:    quit smoking 20+yrs ago  Substance Use Topics   Alcohol use: No   Drug use: No    Home Medications Prior to Admission medications   Medication Sig Start Date End Date Taking? Authorizing Provider  acetaminophen (TYLENOL) 500 MG tablet Take 500 mg by mouth every 6 (six) hours as needed for mild pain or headache.     [provider]  acetaminophen-codeine (TYLENOL #3) 300-30 MG tablet Take 1 tablet by mouth every 4 (four) hours as needed for moderate pain.  05/31/16   [provider]  ALPRAZolam Duanne Moron) 0.5 MG tablet Take 0.5 mg by mouth at bedtime.    [provider]  amitriptyline (ELAVIL) 25 MG tablet Take 25 mg by mouth at bedtime.    [provider]  aspirin 81 MG tablet Take 81 mg by mouth daily.    [provider]  atorvastatin  (LIPITOR) 10 MG tablet Take 10 mg by mouth daily.    [provider]  carbamazepine (TEGRETOL) 200 MG tablet Take 400 mg by mouth 3 (three) times daily.     [provider]  cholecalciferol (VITAMIN D) 1000 UNITS tablet Take 1,000 Units by mouth daily.    [provider]  escitalopram (LEXAPRO) 10 MG tablet Take 20 mg by mouth daily.    [provider]  fish oil-omega-3 fatty acids 1000 MG capsule Take 1 g by mouth daily.     [provider]  lisinopril-hydrochlorothiazide (PRINZIDE,ZESTORETIC) 10-12.5 MG tablet Take 1 tablet by mouth daily.    [provider]  metFORMIN (GLUCOPHAGE-XR) 500 MG 24 hr tablet Take 500 mg by mouth daily with breakfast.    [provider]  metoprolol tartrate (LOPRESSOR) 25 MG tablet Take 1 tablet (25 mg total) by mouth 2 (two) times daily. 08/11/16 12/21/18  Barrett, Evelene Croon, PA-C  tolterodine (DETROL LA) 4 MG 24 hr capsule Take 4 mg by mouth daily.    [provider]    Allergies    Patient has no known allergies.  Review of Systems   Review of Systems  Constitutional:  Negative for diaphoresis and fever.  HENT:  Negative for sore throat.   Eyes:  Negative for visual disturbance.  Respiratory:  Positive for shortness of breath. Negative for cough.   Cardiovascular:  Positive for chest pain.  Gastrointestinal:  Positive for abdominal pain, heartburn and nausea. Negative for vomiting.  Genitourinary:  Negative for dysuria.  Musculoskeletal:  Positive for back pain.  Skin:  Negative for rash.  Neurological:  Negative for headaches.   Physical Exam Updated Vital Signs BP (!) 153/64   Pulse 63   Temp 98.1 F (36.7 C) (Oral)   Resp 20   Ht '5\' 4"'$  (1.626 m)   Wt 104.3 kg   SpO2 99%   BMI 39.48 kg/m   Physical Exam Vitals and nursing note reviewed.  Constitutional:      General: She is not in acute distress.    Appearance: Normal appearance. She is well-developed.  HENT:      Head: Normocephalic and atraumatic.  Eyes:     Conjunctiva/sclera: Conjunctivae normal.  Cardiovascular:     Rate and Rhythm: Normal rate and regular rhythm.     Heart sounds: No murmur heard. Pulmonary:     Effort: Pulmonary effort is normal. No respiratory distress.     Breath  sounds: Normal breath sounds.  Abdominal:     Palpations: Abdomen is soft. There is no mass.     Tenderness: There is no abdominal tenderness. There is no guarding.  Musculoskeletal:        General: Normal range of motion.     Cervical back: Neck supple.     Right lower leg: No tenderness. No edema.     Left lower leg: No tenderness. No edema.  Skin:    General: Skin is warm and dry.  Neurological:     General: No focal deficit present.     Mental Status: She is alert.    ED Results / Procedures / Treatments   Labs (all labs ordered are listed, but only abnormal results are displayed) Labs Reviewed  BASIC METABOLIC PANEL - Abnormal; Notable for the following components:      Result Value   Glucose, Bld 125 (*)    All other components within normal limits  HEPATIC FUNCTION PANEL - Abnormal; Notable for the following components:   Indirect Bilirubin 0.2 (*)    All other components within normal limits  RESP PANEL BY RT-PCR (FLU A&B, COVID) ARPGX2  CBC  PROTIME-INR  LIPASE, BLOOD  TROPONIN I (HIGH SENSITIVITY)  TROPONIN I (HIGH SENSITIVITY)    EKG EKG Interpretation  Date/Time:  Sunday February 02 2021 12:01:24 EDT Ventricular Rate:  63 PR Interval:  176 QRS Duration: 78 QT Interval:  380 QTC Calculation: 388 R Axis:   93 Text Interpretation: Normal sinus rhythm Rightward axis Inferior infarct , age undetermined Cannot rule out Anterior infarct , age undetermined Abnormal ECG No significant change since prior 6/20 Confirmed by Aletta Edouard [phone]) on 02/02/2021 12:07:18 PM  Radiology DG Chest 2 View  Result Date: 02/02/2021 CLINICAL DATA:  Chest pain. EXAM: CHEST - 2 VIEW COMPARISON:  December 21, 2018 FINDINGS: The heart size and mediastinal contours are within normal limits. Both lungs are clear. The visualized skeletal structures are unremarkable. IMPRESSION: No active cardiopulmonary disease. Electronically Signed   By: Dorise Bullion III M.D   On: 02/02/2021 12:42    Procedures Procedures   Medications Ordered in ED Medications  ondansetron [facility]) injection 4 mg (has no administration in time range)  morphine 4 MG/ML injection 4 mg (has no administration in time range)    ED Course  I have reviewed the triage vital signs and the nursing notes.  Pertinent labs & imaging results that were available during my care of the patient were reviewed by me and considered in my medical decision making (see chart for details).  Clinical Course as of 02/03/21 1141  Sun Feb 02, 2021  1353 GI cocktail did not change her symptoms.  Still complaining of burning epigastric pain into her back. [MB]    Clinical Course User Index [MB] Hayden Rasmussen, MD   MDM Rules/Calculators/A&P                          This patient complains of upper abdominal and chest pain burning through to the back; this involves an extensive number of treatment Options and is a complaint that carries with it a high risk of complications and Morbidity. The differential includes gastritis, peptic ulcer disease, cholelithiasis, cholecystitis, ACS, pneumonia, PE  I ordered, reviewed and interpreted labs, which included CBC with normal white count normal hemoglobin, chemistries normal, LFTs normal, COVID testing negative, troponins flat I ordered medication IV pain medicine, nausea medication, IV Pepcid,  oral Carafate I ordered imaging studies which included chest x-ray and I independently    visualized and interpreted imaging which showed no acute findings  Previous records obtained and reviewed in epic no recent admissions  After the interventions stated above, I reevaluated the patient and found patient be  in no distress and hemodynamically stable.  Reviewed results of her work-up of her.  Recommended close follow-up with PCP.  Return instructions discussed   Final Clinical Impression(s) / ED Diagnoses Final diagnoses:  Nonspecific chest pain  Gastroesophageal reflux disease, unspecified whether esophagitis present    Rx / DC Orders ED Discharge Orders          Ordered    omeprazole (PRILOSEC) 20 MG capsule  Daily        02/02/21 1442             Hayden Rasmussen, MD 02/03/21 1143

## 2021-02-02 NOTE — ED Notes (Signed)
Judson Roch Sister - [phone]

## 2021-02-02 NOTE — ED Triage Notes (Signed)
Pt c/o mid CP x 2-3 days, describes as burning, constant

## 2021-02-02 NOTE — ED Notes (Signed)
Patient transported to X-ray 

## 2021-02-02 NOTE — Discharge Instructions (Addendum)
You were seen in the emergency department for burning upper abdominal and chest pain.  You had blood work EKG chest x-ray that did not show any evidence of heart injury.  We are starting you on some acid medication.  You can also use Maalox between meals and at bedtime.  Follow-up with your regular doctor.  Return to the emergency department if any worsening or concerning symptoms

## 2021-02-02 NOTE — ED Notes (Signed)
Crackers and sprite given to pt.

## 2021-02-04 DIAGNOSIS — R197 Diarrhea, unspecified: Secondary | ICD-10-CM | POA: Diagnosis not present

## 2021-02-04 DIAGNOSIS — R110 Nausea: Secondary | ICD-10-CM | POA: Diagnosis not present

## 2021-02-04 DIAGNOSIS — R1013 Epigastric pain: Secondary | ICD-10-CM | POA: Diagnosis not present

## 2021-02-05 DIAGNOSIS — R918 Other nonspecific abnormal finding of lung field: Secondary | ICD-10-CM | POA: Diagnosis not present

## 2021-02-05 DIAGNOSIS — Z7984 Long term (current) use of oral hypoglycemic drugs: Secondary | ICD-10-CM | POA: Diagnosis not present

## 2021-02-05 DIAGNOSIS — R079 Chest pain, unspecified: Secondary | ICD-10-CM | POA: Diagnosis not present

## 2021-02-05 DIAGNOSIS — R0789 Other chest pain: Secondary | ICD-10-CM | POA: Diagnosis not present

## 2021-02-05 DIAGNOSIS — K2900 Acute gastritis without bleeding: Secondary | ICD-10-CM | POA: Diagnosis not present

## 2021-02-05 DIAGNOSIS — Z79899 Other long term (current) drug therapy: Secondary | ICD-10-CM | POA: Diagnosis not present

## 2021-02-05 DIAGNOSIS — K2970 Gastritis, unspecified, without bleeding: Secondary | ICD-10-CM | POA: Diagnosis not present

## 2021-02-05 DIAGNOSIS — R9431 Abnormal electrocardiogram [ECG] [EKG]: Secondary | ICD-10-CM | POA: Diagnosis not present

## 2021-02-05 DIAGNOSIS — R001 Bradycardia, unspecified: Secondary | ICD-10-CM | POA: Diagnosis not present

## 2021-02-10 ENCOUNTER — Other Ambulatory Visit: Payer: Self-pay | Admitting: Physician Assistant

## 2021-02-10 DIAGNOSIS — R1013 Epigastric pain: Secondary | ICD-10-CM | POA: Diagnosis not present

## 2021-02-10 DIAGNOSIS — Z800 Family history of malignant neoplasm of digestive organs: Secondary | ICD-10-CM | POA: Diagnosis not present

## 2021-02-10 DIAGNOSIS — R110 Nausea: Secondary | ICD-10-CM | POA: Diagnosis not present

## 2021-02-10 DIAGNOSIS — M545 Low back pain, unspecified: Secondary | ICD-10-CM | POA: Diagnosis not present

## 2021-02-10 DIAGNOSIS — R197 Diarrhea, unspecified: Secondary | ICD-10-CM | POA: Diagnosis not present

## 2021-02-10 DIAGNOSIS — G959 Disease of spinal cord, unspecified: Secondary | ICD-10-CM | POA: Diagnosis not present

## 2021-02-12 ENCOUNTER — Ambulatory Visit
Admission: RE | Admit: 2021-02-12 | Discharge: 2021-02-12 | Disposition: A | Discharge status: Home / Self Care | Payer: Medicare Other | Source: Ambulatory Visit | Attending: Physician Assistant | Admitting: Physician Assistant

## 2021-02-12 ENCOUNTER — Other Ambulatory Visit: Payer: Self-pay | Admitting: Physician Assistant

## 2021-02-12 DIAGNOSIS — R1013 Epigastric pain: Secondary | ICD-10-CM

## 2021-02-12 DIAGNOSIS — K219 Gastro-esophageal reflux disease without esophagitis: Secondary | ICD-10-CM | POA: Diagnosis not present

## 2021-02-24 DIAGNOSIS — M4322 Fusion of spine, cervical region: Secondary | ICD-10-CM | POA: Diagnosis not present

## 2021-02-24 DIAGNOSIS — M47813 Spondylosis without myelopathy or radiculopathy, cervicothoracic region: Secondary | ICD-10-CM | POA: Diagnosis not present

## 2021-02-24 DIAGNOSIS — G959 Disease of spinal cord, unspecified: Secondary | ICD-10-CM | POA: Diagnosis not present

## 2021-02-24 DIAGNOSIS — M50221 Other cervical disc displacement at C4-C5 level: Secondary | ICD-10-CM | POA: Diagnosis not present

## 2021-02-24 DIAGNOSIS — G9589 Other specified diseases of spinal cord: Secondary | ICD-10-CM | POA: Diagnosis not present

## 2021-02-24 DIAGNOSIS — M5021 Other cervical disc displacement,  high cervical region: Secondary | ICD-10-CM | POA: Diagnosis not present

## 2021-03-02 ENCOUNTER — Emergency Department (HOSPITAL_BASED_OUTPATIENT_CLINIC_OR_DEPARTMENT_OTHER): Payer: Medicare Other | Admitting: Radiology

## 2021-03-02 ENCOUNTER — Other Ambulatory Visit: Payer: Self-pay

## 2021-03-02 ENCOUNTER — Encounter (HOSPITAL_BASED_OUTPATIENT_CLINIC_OR_DEPARTMENT_OTHER): Payer: Self-pay | Admitting: Emergency Medicine

## 2021-03-02 ENCOUNTER — Inpatient Hospital Stay (HOSPITAL_BASED_OUTPATIENT_CLINIC_OR_DEPARTMENT_OTHER)
Admission: EM | Admit: 2021-03-02 | Discharge: 2021-03-06 | DRG: 291 | Disposition: A | Discharge status: Home / Self Care | Payer: Medicare Other | Attending: Family Medicine | Admitting: Family Medicine

## 2021-03-02 DIAGNOSIS — R0902 Hypoxemia: Secondary | ICD-10-CM

## 2021-03-02 DIAGNOSIS — M542 Cervicalgia: Secondary | ICD-10-CM | POA: Diagnosis present

## 2021-03-02 DIAGNOSIS — E118 Type 2 diabetes mellitus with unspecified complications: Secondary | ICD-10-CM | POA: Diagnosis not present

## 2021-03-02 DIAGNOSIS — E871 Hypo-osmolality and hyponatremia: Secondary | ICD-10-CM | POA: Diagnosis present

## 2021-03-02 DIAGNOSIS — Z8051 Family history of malignant neoplasm of kidney: Secondary | ICD-10-CM | POA: Diagnosis not present

## 2021-03-02 DIAGNOSIS — Z6841 Body Mass Index (BMI) 40.0 and over, adult: Secondary | ICD-10-CM

## 2021-03-02 DIAGNOSIS — N179 Acute kidney failure, unspecified: Secondary | ICD-10-CM | POA: Diagnosis not present

## 2021-03-02 DIAGNOSIS — K219 Gastro-esophageal reflux disease without esophagitis: Secondary | ICD-10-CM | POA: Diagnosis present

## 2021-03-02 DIAGNOSIS — J810 Acute pulmonary edema: Secondary | ICD-10-CM

## 2021-03-02 DIAGNOSIS — Z7984 Long term (current) use of oral hypoglycemic drugs: Secondary | ICD-10-CM

## 2021-03-02 DIAGNOSIS — I509 Heart failure, unspecified: Secondary | ICD-10-CM

## 2021-03-02 DIAGNOSIS — Z800 Family history of malignant neoplasm of digestive organs: Secondary | ICD-10-CM

## 2021-03-02 DIAGNOSIS — F32A Depression, unspecified: Secondary | ICD-10-CM | POA: Diagnosis present

## 2021-03-02 DIAGNOSIS — E785 Hyperlipidemia, unspecified: Secondary | ICD-10-CM | POA: Diagnosis not present

## 2021-03-02 DIAGNOSIS — Z981 Arthrodesis status: Secondary | ICD-10-CM

## 2021-03-02 DIAGNOSIS — I5033 Acute on chronic diastolic (congestive) heart failure: Secondary | ICD-10-CM | POA: Diagnosis not present

## 2021-03-02 DIAGNOSIS — Z833 Family history of diabetes mellitus: Secondary | ICD-10-CM

## 2021-03-02 DIAGNOSIS — I110 Hypertensive heart disease with heart failure: Principal | ICD-10-CM | POA: Diagnosis present

## 2021-03-02 DIAGNOSIS — J9601 Acute respiratory failure with hypoxia: Secondary | ICD-10-CM | POA: Diagnosis not present

## 2021-03-02 DIAGNOSIS — Z20822 Contact with and (suspected) exposure to covid-19: Secondary | ICD-10-CM | POA: Diagnosis not present

## 2021-03-02 DIAGNOSIS — G4700 Insomnia, unspecified: Secondary | ICD-10-CM | POA: Diagnosis not present

## 2021-03-02 DIAGNOSIS — I5043 Acute on chronic combined systolic (congestive) and diastolic (congestive) heart failure: Secondary | ICD-10-CM | POA: Diagnosis present

## 2021-03-02 DIAGNOSIS — Z8052 Family history of malignant neoplasm of bladder: Secondary | ICD-10-CM | POA: Diagnosis not present

## 2021-03-02 DIAGNOSIS — I10 Essential (primary) hypertension: Secondary | ICD-10-CM | POA: Diagnosis not present

## 2021-03-02 DIAGNOSIS — G8929 Other chronic pain: Secondary | ICD-10-CM | POA: Diagnosis present

## 2021-03-02 DIAGNOSIS — M48061 Spinal stenosis, lumbar region without neurogenic claudication: Secondary | ICD-10-CM | POA: Diagnosis not present

## 2021-03-02 DIAGNOSIS — G4733 Obstructive sleep apnea (adult) (pediatric): Secondary | ICD-10-CM | POA: Diagnosis present

## 2021-03-02 DIAGNOSIS — Z7901 Long term (current) use of anticoagulants: Secondary | ICD-10-CM

## 2021-03-02 DIAGNOSIS — Z79899 Other long term (current) drug therapy: Secondary | ICD-10-CM | POA: Diagnosis not present

## 2021-03-02 DIAGNOSIS — R918 Other nonspecific abnormal finding of lung field: Secondary | ICD-10-CM | POA: Diagnosis not present

## 2021-03-02 DIAGNOSIS — E876 Hypokalemia: Secondary | ICD-10-CM | POA: Diagnosis not present

## 2021-03-02 DIAGNOSIS — R0602 Shortness of breath: Secondary | ICD-10-CM | POA: Diagnosis not present

## 2021-03-02 DIAGNOSIS — Z8719 Personal history of other diseases of the digestive system: Secondary | ICD-10-CM

## 2021-03-02 DIAGNOSIS — E119 Type 2 diabetes mellitus without complications: Secondary | ICD-10-CM | POA: Diagnosis present

## 2021-03-02 DIAGNOSIS — Z8249 Family history of ischemic heart disease and other diseases of the circulatory system: Secondary | ICD-10-CM

## 2021-03-02 DIAGNOSIS — I5040 Unspecified combined systolic (congestive) and diastolic (congestive) heart failure: Secondary | ICD-10-CM | POA: Diagnosis not present

## 2021-03-02 DIAGNOSIS — Z8673 Personal history of transient ischemic attack (TIA), and cerebral infarction without residual deficits: Secondary | ICD-10-CM

## 2021-03-02 DIAGNOSIS — K2970 Gastritis, unspecified, without bleeding: Secondary | ICD-10-CM | POA: Diagnosis present

## 2021-03-02 DIAGNOSIS — Z90710 Acquired absence of both cervix and uterus: Secondary | ICD-10-CM

## 2021-03-02 DIAGNOSIS — Z83438 Family history of other disorder of lipoprotein metabolism and other lipidemia: Secondary | ICD-10-CM

## 2021-03-02 DIAGNOSIS — J90 Pleural effusion, not elsewhere classified: Secondary | ICD-10-CM | POA: Diagnosis not present

## 2021-03-02 DIAGNOSIS — D649 Anemia, unspecified: Secondary | ICD-10-CM | POA: Diagnosis present

## 2021-03-02 DIAGNOSIS — Z9049 Acquired absence of other specified parts of digestive tract: Secondary | ICD-10-CM

## 2021-03-02 LAB — RESP PANEL BY RT-PCR (FLU A&B, COVID) ARPGX2
Influenza A by PCR: NEGATIVE
Influenza B by PCR: NEGATIVE
SARS Coronavirus 2 by RT PCR: NEGATIVE

## 2021-03-02 LAB — BASIC METABOLIC PANEL
Anion gap: 8 (ref 5–15)
BUN: 7 mg/dL (ref 6–20)
CO2: 28 mmol/L (ref 22–32)
Calcium: 9 mg/dL (ref 8.9–10.3)
Chloride: 95 mmol/L — ABNORMAL LOW (ref 98–111)
Creatinine, Ser: 0.72 mg/dL (ref 0.44–1.00)
GFR, Estimated: >60 mL/min (ref >60)
Glucose, Bld: 140 mg/dL — ABNORMAL HIGH (ref 70–99)
Potassium: 4 mmol/L (ref 3.5–5.1)
Sodium: 131 mmol/L — ABNORMAL LOW (ref 135–145)

## 2021-03-02 LAB — CBC WITH DIFFERENTIAL/PLATELET
Abs Immature Granulocytes: 0.02 10*3/uL (ref 0.00–0.07)
Basophils Absolute: 0 10*3/uL (ref 0.0–0.1)
Basophils Relative: 0 %
Eosinophils Absolute: 0.1 10*3/uL (ref 0.0–0.5)
Eosinophils Relative: 1 %
HCT: 31.6 % — ABNORMAL LOW (ref 36.0–46.0)
Hemoglobin: 11 g/dL — ABNORMAL LOW (ref 12.0–15.0)
Immature Granulocytes: 0 %
Lymphocytes Relative: 12 %
Lymphs Abs: 1 10*3/uL (ref 0.7–4.0)
MCH: 32.5 pg (ref 26.0–34.0)
MCHC: 34.8 g/dL (ref 30.0–36.0)
MCV: 93.5 fL (ref 80.0–100.0)
Monocytes Absolute: 0.8 10*3/uL (ref 0.1–1.0)
Monocytes Relative: 11 %
Neutro Abs: 5.9 10*3/uL (ref 1.7–7.7)
Neutrophils Relative %: 76 %
Platelets: 214 10*3/uL (ref 150–400)
RBC: 3.38 MIL/uL — ABNORMAL LOW (ref 3.87–5.11)
RDW: 12 % (ref 11.5–15.5)
WBC: 7.9 10*3/uL (ref 4.0–10.5)
nRBC: 0 % (ref 0.0–0.2)

## 2021-03-02 LAB — HEMOGLOBIN A1C
Hgb A1c MFr Bld: 6.2 % — ABNORMAL HIGH (ref 4.8–5.6)
Mean Plasma Glucose: 131.24 mg/dL

## 2021-03-02 LAB — TSH: TSH: 1.125 u[IU]/mL (ref 0.350–4.500)

## 2021-03-02 LAB — BRAIN NATRIURETIC PEPTIDE: B Natriuretic Peptide: 385 pg/mL — ABNORMAL HIGH (ref 0.0–100.0)

## 2021-03-02 LAB — GLUCOSE, CAPILLARY
Glucose-Capillary: 161 mg/dL — ABNORMAL HIGH (ref 70–99)
Glucose-Capillary: 120 mg/dL — ABNORMAL HIGH (ref 70–99)

## 2021-03-02 LAB — TROPONIN I (HIGH SENSITIVITY)
Troponin I (High Sensitivity): 9 ng/L (ref <18)
Troponin I (High Sensitivity): 7 ng/L (ref <18)

## 2021-03-02 LAB — HIV ANTIBODY (ROUTINE TESTING W REFLEX): HIV Screen 4th Generation wRfx: NONREACTIVE

## 2021-03-02 MED ORDER — ENOXAPARIN SODIUM 40 MG/0.4ML IJ SOSY
40.0000 mg | PREFILLED_SYRINGE | INTRAMUSCULAR | Status: DC
  Administered 2021-03-02 – 2021-03-05 (×4): 40 mg via SUBCUTANEOUS
  Filled 2021-03-02 (×4): qty 0.4

## 2021-03-02 MED ORDER — FUROSEMIDE 10 MG/ML IJ SOLN
40.0000 mg | Freq: Two times a day (BID) | INTRAMUSCULAR | Status: DC
  Administered 2021-03-02 – 2021-03-05 (×7): 40 mg via INTRAVENOUS
  Filled 2021-03-02 (×7): qty 4

## 2021-03-02 MED ORDER — CARBAMAZEPINE 200 MG PO TABS
600.0000 mg | ORAL_TABLET | Freq: Two times a day (BID) | ORAL | Status: DC
  Administered 2021-03-02 – 2021-03-06 (×8): 600 mg via ORAL
  Filled 2021-03-02 (×9): qty 3

## 2021-03-02 MED ORDER — METOPROLOL TARTRATE 25 MG PO TABS
25.0000 mg | ORAL_TABLET | Freq: Two times a day (BID) | ORAL | Status: DC
  Administered 2021-03-02 – 2021-03-06 (×8): 25 mg via ORAL
  Filled 2021-03-02 (×10): qty 1

## 2021-03-02 MED ORDER — SODIUM CHLORIDE 0.9 % IV SOLN: 250.0000 mL | INTRAVENOUS | Status: DC | PRN

## 2021-03-02 MED ORDER — ACETAMINOPHEN 325 MG PO TABS
650.0000 mg | ORAL_TABLET | Freq: Once | ORAL | Status: AC
  Administered 2021-03-02: 650 mg via ORAL
  Filled 2021-03-02: qty 2

## 2021-03-02 MED ORDER — SODIUM CHLORIDE 0.9% FLUSH: 3.0000 mL | INTRAVENOUS | Status: DC | PRN

## 2021-03-02 MED ORDER — SODIUM CHLORIDE 0.9% FLUSH
3.0000 mL | Freq: Two times a day (BID) | INTRAVENOUS | Status: DC
  Administered 2021-03-02 – 2021-03-06 (×8): 3 mL via INTRAVENOUS

## 2021-03-02 MED ORDER — SUCRALFATE 1 G PO TABS
1.0000 g | ORAL_TABLET | Freq: Three times a day (TID) | ORAL | Status: DC
  Administered 2021-03-02 – 2021-03-06 (×12): 1 g via ORAL
  Filled 2021-03-02 (×12): qty 1

## 2021-03-02 MED ORDER — DULOXETINE HCL 60 MG PO CPEP
60.0000 mg | ORAL_CAPSULE | Freq: Two times a day (BID) | ORAL | Status: DC
  Administered 2021-03-02 – 2021-03-06 (×8): 60 mg via ORAL
  Filled 2021-03-02 (×8): qty 1

## 2021-03-02 MED ORDER — ACETAMINOPHEN 325 MG PO TABS
650.0000 mg | ORAL_TABLET | ORAL | Status: DC | PRN
  Administered 2021-03-03: 650 mg via ORAL
  Filled 2021-03-02: qty 2

## 2021-03-02 MED ORDER — BUPROPION HCL ER (XL) 150 MG PO TB24
300.0000 mg | ORAL_TABLET | Freq: Every morning | ORAL | Status: DC
  Administered 2021-03-03 – 2021-03-06 (×4): 300 mg via ORAL
  Filled 2021-03-02 (×5): qty 2

## 2021-03-02 MED ORDER — METHOCARBAMOL 500 MG PO TABS
750.0000 mg | ORAL_TABLET | Freq: Three times a day (TID) | ORAL | Status: DC | PRN
  Administered 2021-03-02: 750 mg via ORAL
  Filled 2021-03-02: qty 2

## 2021-03-02 MED ORDER — ONDANSETRON HCL 4 MG/2ML IJ SOLN: 4.0000 mg | Freq: Four times a day (QID) | INTRAMUSCULAR | Status: DC | PRN

## 2021-03-02 MED ORDER — PANTOPRAZOLE SODIUM 40 MG PO TBEC
40.0000 mg | DELAYED_RELEASE_TABLET | Freq: Two times a day (BID) | ORAL | Status: DC
  Administered 2021-03-02 – 2021-03-06 (×8): 40 mg via ORAL
  Filled 2021-03-02 (×8): qty 1

## 2021-03-02 MED ORDER — DICYCLOMINE HCL 20 MG PO TABS
20.0000 mg | ORAL_TABLET | Freq: Three times a day (TID) | ORAL | Status: DC | PRN
  Filled 2021-03-02: qty 1

## 2021-03-02 MED ORDER — FLUTICASONE PROPIONATE 50 MCG/ACT NA SUSP
1.0000 | Freq: Every day | NASAL | Status: DC | PRN
  Filled 2021-03-02: qty 16

## 2021-03-02 MED ORDER — ATORVASTATIN CALCIUM 10 MG PO TABS
10.0000 mg | ORAL_TABLET | Freq: Every day | ORAL | Status: DC
  Administered 2021-03-02: 10 mg via ORAL
  Filled 2021-03-02: qty 1

## 2021-03-02 MED ORDER — FUROSEMIDE 10 MG/ML IJ SOLN
40.0000 mg | Freq: Once | INTRAMUSCULAR | Status: AC
  Administered 2021-03-02: 40 mg via INTRAVENOUS
  Filled 2021-03-02: qty 4

## 2021-03-02 MED ORDER — INSULIN ASPART 100 UNIT/ML IJ SOLN
0.0000 [IU] | Freq: Three times a day (TID) | INTRAMUSCULAR | Status: DC
  Administered 2021-03-02 – 2021-03-04 (×4): 2 [IU] via SUBCUTANEOUS
  Administered 2021-03-04: 1 [IU] via SUBCUTANEOUS
  Administered 2021-03-05: 2 [IU] via SUBCUTANEOUS
  Administered 2021-03-06 (×2): 1 [IU] via SUBCUTANEOUS

## 2021-03-02 MED ORDER — LOSARTAN POTASSIUM 25 MG PO TABS
25.0000 mg | ORAL_TABLET | Freq: Every morning | ORAL | Status: DC
  Administered 2021-03-03 – 2021-03-06 (×4): 25 mg via ORAL
  Filled 2021-03-02 (×4): qty 1

## 2021-03-02 MED ORDER — TRAZODONE HCL 100 MG PO TABS
200.0000 mg | ORAL_TABLET | Freq: Every day | ORAL | Status: DC
  Administered 2021-03-02 – 2021-03-05 (×4): 200 mg via ORAL
  Filled 2021-03-02 (×4): qty 2

## 2021-03-02 NOTE — Progress Notes (Signed)
Pt has refused cpap at this.  Pt aware she can request it at anytime if she changes her mind.  RT will continue to monitor.

## 2021-03-02 NOTE — ED Triage Notes (Signed)
Pt c/o SOB with mild cough since yesterday along with DOE.  Pt 02 88% on RA.  2L O2 applied during triage.

## 2021-03-02 NOTE — ED Notes (Signed)
She ambulates to b.r. at this time. Upon her return to the room, her SPO2 was 80% on rm. Air. Upon re-initiating O2 at 2 l.p.m. her SPO2 immediately goes up to 98%. She remains in nsr per monitor.

## 2021-03-02 NOTE — Plan of Care (Signed)
  Problem: Education: Goal: Knowledge of General Education information will improve Description: Including pain rating scale, medication(s)/side effects and non-pharmacologic comfort measures Outcome: Progressing   Problem: Health Behavior/Discharge Planning: Goal: Ability to manage health-related needs will improve Outcome: Progressing   Problem: Clinical Measurements: Goal: Ability to maintain clinical measurements within normal limits will improve Outcome: Progressing Goal: Respiratory complications will improve Outcome: Progressing Goal: Cardiovascular complication will be avoided Outcome: Progressing   Problem: Activity: Goal: Risk for activity intolerance will decrease Outcome: Progressing   Problem: Coping: Goal: Level of anxiety will decrease Outcome: Progressing   Problem: Nutrition: Goal: Adequate nutrition will be maintained Outcome: Completed/Met   Problem: Elimination: Goal: Will not experience complications related to urinary retention Outcome: Completed/Met

## 2021-03-02 NOTE — H&P (Addendum)
History and Physical    [name] [id number] DOB: 08-Mar-1961 DOA: 03/02/2021  Referring MD/NP/PA: Lavenia Atlas, DO PCP: Ramiro Harvest, PA-C  Patient coming from: Transfer from [facility]  Chief Complaint: Shortness of breath  I have personally briefly reviewed patient's old medical records in [place]   HPI: [name] is a 60 y.o. female with medical history significant of hypertension, diastolic CHF, hyperlipidemia, hyponatremia, cervical myelopathy s/p and diabetes mellitus type 2 who presents with complaints of shortness of breath.  Symptoms acutely worsened yesterday with complaints of pressure-like chest discomfort, nonproductive cough, shortness of breath with exertion, intermittent palpitations, and orthopnea.  She intermittently has lower extremity swelling, but did note that her weight was up 6-7 pounds from last time she had checked it.  Reports being advised to stop furosemide within the last month, but cannot tell me exactly why.  to her shortness of breath she had taken a dose of furosemide yesterday, but states that it had provided no relief.  Denies having any fever, nausea, vomiting, loss of consciousness, dysuria, or recent sick contacts to her knowledge.  She had briefly smoked tobacco over 25 years ago.  ED Course: Upon admission into the emergency department patient was seen to be afebrile with respirations 16-24, blood pressure was elevated to 171/83, and O2 saturations reported to be as low as 87% with improvement on 2 L of nasal cannula oxygen greater than 92%.  Labs significant for hemoglobin 11, sodium 139, potassium 4, BNP 385, and high-sensitivity troponins negative x2.  Chest x-ray noted bilateral interstitial and alveolar opacities concerning for atypical infection.  COVID-19 and influenza screening were negative.  Patient has been given Tylenol and Lasix 40 mg IV.  Review of Systems  Constitutional:  Positive for  malaise/fatigue. Negative for fever.  Eyes:  Negative for photophobia and pain.  Respiratory:  Positive for cough, shortness of breath and wheezing. Negative for stridor.   Cardiovascular:  Positive for chest pain, palpitations and leg swelling.  Gastrointestinal:  Negative for abdominal pain, blood in stool, nausea and vomiting.  Genitourinary:  Negative for dysuria and flank pain.  Musculoskeletal:  Positive for back pain and neck pain. Negative for falls.  Skin:  Negative for rash.  Neurological:  Negative for focal weakness and loss of consciousness.  Endo/Heme/Allergies:  Negative for polydipsia.  Psychiatric/Behavioral:  Negative for substance abuse. The patient has insomnia.    Past Medical History:  Diagnosis Date   Aortic valve calcification    thickened   Arthritis    Chronic back pain    stenosis   Depression    takes Lexapro daily   Diabetes mellitus (HCC)    Headache    History of bronchitis 2 yrs ago   History of colon polyps    benign   Hyperlipidemia    Hypertension    takes Lisinopril and HCTZ daily   Insomnia    takes Xanax at bedtime   Murmur, cardiac    Nocturia    OSA on CPAP    PONV (postoperative nausea and vomiting)    Urinary frequency    takes Ditropan nightly   Urinary urgency    Vertigo    hx of that required Meclizine    Weakness    numbness and tingling in both legs and occasionally in the left    Past Surgical History:  Procedure Laterality Date   ABDOMINAL HYSTERECTOMY  1998   BACK SURGERY     bladder  tack     x 2    BREAST CYST ASPIRATION Right    CARDIAC CATHETERIZATION N/A 06/23/2016   Procedure: Left Heart Cath and Coronary Angiography;  Surgeon: Belva Crome, MD;  Location: [facility];  Service: Cardiovascular;  Laterality: N/A;   CERVICAL FUSION     CHOLECYSTECTOMY     COLONOSCOPY       reports that she has quit smoking. She has never used smokeless tobacco. She reports that she does not drink alcohol and does  not use drugs.  No Known Allergies  Family History  Problem Relation Age of Onset   Hypertension Father    Cerebrovascular Disease Father    Bladder Cancer Father    Colon cancer Mother    Hypertension Mother    Hyperlipidemia Mother    Atrial fibrillation Mother    Kidney cancer Brother    Hypertension Brother    Diabetes Brother    Diabetes Brother    Cerebrovascular Disease Brother    Atrial fibrillation Brother    Hypertension Sister    Diabetes Sister    Sleep apnea Other        siblings   Breast cancer Neg Hx     Prior to Admission medications   Medication Sig Start Date End Date Taking? Authorizing Provider  sucralfate (CARAFATE) 1 GM/10ML suspension Take 1 g by mouth 4 (four) times daily -  with meals and at bedtime.   Yes [provider]  acetaminophen (TYLENOL) 500 MG tablet Take 500 mg by mouth every 6 (six) hours as needed for mild pain or headache.     [provider]  acetaminophen-codeine (TYLENOL #3) 300-30 MG tablet Take 1 tablet by mouth every 4 (four) hours as needed for moderate pain.  05/31/16   [provider]  ALPRAZolam Duanne Moron) 0.5 MG tablet Take 0.5 mg by mouth at bedtime.    [provider]  amitriptyline (ELAVIL) 25 MG tablet Take 25 mg by mouth at bedtime.    [provider]  aspirin 81 MG tablet Take 81 mg by mouth daily.    [provider]  atorvastatin (LIPITOR) 10 MG tablet Take 10 mg by mouth daily.    [provider]  carbamazepine (TEGRETOL) 200 MG tablet Take 400 mg by mouth 3 (three) times daily.     [provider]  cholecalciferol (VITAMIN D) 1000 UNITS tablet Take 1,000 Units by mouth daily.    [provider]  escitalopram (LEXAPRO) 10 MG tablet Take 20 mg by mouth daily.    [provider]  fish oil-omega-3 fatty acids 1000 MG capsule Take 1 g by mouth daily.     [provider]  lisinopril-hydrochlorothiazide (PRINZIDE,ZESTORETIC) 10-12.5  MG tablet Take 1 tablet by mouth daily.    [provider]  metFORMIN (GLUCOPHAGE-XR) 500 MG 24 hr tablet Take 500 mg by mouth daily with breakfast.    [provider]  metoprolol tartrate (LOPRESSOR) 25 MG tablet Take 1 tablet (25 mg total) by mouth 2 (two) times daily. 08/11/16 12/21/18  Barrett, Evelene Croon, PA-C  omeprazole (PRILOSEC) 20 MG capsule Take 1 capsule (20 mg total) by mouth daily. 02/02/21   Hayden Rasmussen, MD  tolterodine (DETROL LA) 4 MG 24 hr capsule Take 4 mg by mouth daily.    [provider]    Physical Exam:  Constitutional: Middle-age female who appears to be in no acute distress at this time Vitals:   03/02/21 1239  03/02/21 1245 03/02/21 1302 03/02/21 1500  BP: (!) 134/58 139/75 (!) 152/97 138/65  Pulse: 73 71 74 70  Resp: 20 16 (!) 24 18  Temp:    99.5 F (37.5 C)  TempSrc:    Oral  SpO2: 98% 99% 99% 98%  Weight:      Height:       Eyes: PERRL, lids and conjunctivae normal ENMT: Mucous membranes are moist. Posterior pharynx clear of any exudate or lesions.  Neck: normal, supple, no masses. Respiratory: Normal respiratory effort currently without significant crackles, wheezes, or rhonchi appreciated at this time.  O2 saturation maintained on 2 L nasal cannula oxygen. Cardiovascular: Regular rate and rhythm, no murmurs / rubs / gallops.  +1 pitting lower extremity edema. 2+ pedal pulses. No carotid bruits.  Abdomen: no tenderness, no masses palpated. No hepatosplenomegaly. Bowel sounds positive.  Musculoskeletal: no clubbing / cyanosis.  Decreased range of motion in lower back Skin: no rashes, lesions, ulcers. No induration Neurologic: CN 2-12 grossly intact. Sensation intact, DTR normal. Strength 5/5 in all 4.  Psychiatric: Normal judgment and insight. Alert and oriented x 3. Normal mood.     Labs on Admission: I have personally reviewed following labs and imaging studies  CBC: Recent Labs  Lab 03/02/21 1057  WBC 7.9   NEUTROABS 5.9  HGB 11.0*  HCT 31.6*  MCV 93.5  PLT [id number]   Basic Metabolic Panel: Recent Labs  Lab 03/02/21 1057  NA 131*  K 4.0  CL 95*  CO2 28  GLUCOSE 140*  BUN 7  CREATININE 0.72  CALCIUM 9.0   GFR: Estimated Creatinine Clearance: 89.1 mL/min (by C-G formula based on SCr of 0.72 mg/dL). Liver Function Tests: No results for input(s): AST, ALT, ALKPHOS, BILITOT, PROT, ALBUMIN in the last 168 hours. No results for input(s): LIPASE, AMYLASE in the last 168 hours. No results for input(s): AMMONIA in the last 168 hours. Coagulation Profile: No results for input(s): INR, PROTIME in the last 168 hours. Cardiac Enzymes: No results for input(s): CKTOTAL, CKMB, CKMBINDEX, TROPONINI in the last 168 hours. BNP (last 3 results) No results for input(s): PROBNP in the last 8760 hours. HbA1C: No results for input(s): HGBA1C in the last 72 hours. CBG: No results for input(s): GLUCAP in the last 168 hours. Lipid Profile: No results for input(s): CHOL, HDL, LDLCALC, TRIG, CHOLHDL, LDLDIRECT in the last 72 hours. Thyroid Function Tests: No results for input(s): TSH, T4TOTAL, FREET4, T3FREE, THYROIDAB in the last 72 hours. Anemia Panel: No results for input(s): VITAMINB12, FOLATE, FERRITIN, TIBC, IRON, RETICCTPCT in the last 72 hours. Urine analysis:    Component Value Date/Time   COLORURINE YELLOW 12/21/2018 [place] 12/21/2018 0447   LABSPEC 1.023 12/21/2018 0447   PHURINE 5.0 12/21/2018 0447   GLUCOSEU NEGATIVE 12/21/2018 0447   HGBUR NEGATIVE 12/21/2018 0447   BILIRUBINUR SMALL (A) 12/21/2018 0447   KETONESUR NEGATIVE 12/21/2018 0447   PROTEINUR NEGATIVE 12/21/2018 0447   NITRITE NEGATIVE 12/21/2018 0447   LEUKOCYTESUR NEGATIVE 12/21/2018 0447   Sepsis Labs: Recent Results (from the past 240 hour(s))  Resp Panel by RT-PCR (Flu A&B, Covid) Nasopharyngeal Swab     Status: None   Collection Time: 03/02/21 11:04 AM   Specimen: Nasopharyngeal Swab;  Nasopharyngeal(NP) swabs in vial transport medium  Result Value Ref Range Status   SARS Coronavirus 2 by RT PCR NEGATIVE NEGATIVE Final    Comment: (NOTE) SARS-CoV-2 target nucleic acids are NOT DETECTED.  The SARS-CoV-2 RNA is generally  detectable in upper respiratory specimens during the acute phase of infection. The lowest concentration of SARS-CoV-2 viral copies this assay can detect is 138 copies/mL. A negative result does not preclude SARS-Cov-2 infection and should not be used as the sole basis for treatment or other patient management decisions. A negative result may occur with  improper specimen collection/handling, submission of specimen other than nasopharyngeal swab, presence of viral mutation(s) within the areas targeted by this assay, and inadequate number of viral copies(<138 copies/mL). A negative result must be combined with clinical observations, patient history, and epidemiological information. The expected result is Negative.  Fact Sheet for Patients:  EntrepreneurPulse.com.au  Fact Sheet for Healthcare Providers:  IncredibleEmployment.be  This test is no t yet approved or cleared by the [place] FDA and  has been authorized for detection and/or diagnosis of SARS-CoV-2 by FDA under an Emergency Use Authorization (EUA). This EUA will remain  in effect (meaning this test can be used) for the duration of the COVID-19 declaration under Section 564(b)(1) of the Act, 21 U.S.C.section 360bbb-3(b)(1), unless the authorization is terminated  or revoked sooner.       Influenza A by PCR NEGATIVE NEGATIVE Final   Influenza B by PCR NEGATIVE NEGATIVE Final    Comment: (NOTE) The Xpert Xpress SARS-CoV-2/FLU/RSV plus assay is intended as an aid in the diagnosis of influenza from Nasopharyngeal swab specimens and should not be used as a sole basis for treatment. Nasal washings and aspirates are unacceptable for Xpert Xpress  SARS-CoV-2/FLU/RSV testing.  Fact Sheet for Patients: EntrepreneurPulse.com.au  Fact Sheet for Healthcare Providers: IncredibleEmployment.be  This test is not yet approved or cleared by the [place] FDA and has been authorized for detection and/or diagnosis of SARS-CoV-2 by FDA under an Emergency Use Authorization (EUA). This EUA will remain in effect (meaning this test can be used) for the duration of the COVID-19 declaration under Section 564(b)(1) of the Act, 21 U.S.C. section 360bbb-3(b)(1), unless the authorization is terminated or revoked.  Performed at [facility], [address] [place], [place] [address]      Radiological Exams on Admission: DG Chest 2 View  Result Date: 03/02/2021 CLINICAL DATA:  Shortness of breath in a 60 year old female. EXAM: CHEST - 2 VIEW COMPARISON:  Comparison made with February 02, 2021. FINDINGS: Trachea is midline. Cardiomediastinal contours are stable accounting for portable AP projection. Bilateral interstitial and alveolar opacities with central predominance and asymmetry are noted. Small effusions are present. No visible pneumothorax. On limited assessment there is no acute skeletal process. IMPRESSION: Bilateral interstitial and alveolar opacities with central predominance and asymmetry, could reflect asymmetric edema or atypical infection. Pattern could also be seen in the setting of pulmonary hemorrhage. Small bilateral pleural effusions. Electronically Signed   By: Zetta Bills M.D.   On: 03/02/2021 11:27    EKG: Independently reviewed.  Normal sinus rhythm at 74 bpm  Assessment/Plan Acute respiratory failure with hypoxia secondary to heart failure with preserved EF: Acute on chronic.  Patient presents with complaints of shortness of breath with reports of 6 to 7 pound weight gain.  BNP was elevated at 385 and chest x-ray gave concern for possible asymmetric edema.  Patient had been  given 40 mg of Lasix IV in the ED.  Last available echocardiogram in 2017 noted EF of 65 to 70% with grade 1 diastolic dysfunction.  It appears patient had repeat echocardiogram back in 2020 at [facility] per review of records on Care Everywhere but details are not  visible.  Suspect that patient being taken off of furosemide 3 weeks ago is the likely cause of her symptoms. -Admit to a telemetry bed -Heart failure orders set  initiated  -Continuous pulse oximetry with nasal cannula oxygen as needed to keep O2 saturations >92% -Strict I&Os and daily weights -Elevate lower extremities -Check TSH -Lasix 40 mg IV Bid -Reassess in a.m. and adjust diuresis as needed -Check echocardiogram -Consider need to formally consult cardiology, but suspect symptoms secondary to recently being discontinued off of furosemide.  For which patient can possibly be diuresed and discharged to follow-up with her cardiologist.  Hyponatremia: Acute.  Suspect secondary to hypervolemic hyponatremia in the setting of CHF exacerbation. -Continue to monitor sodium levels with diuresis  Diabetes mellitus type 2: On admission patient glucose was noted to be 140.  Home medications appear to include metformin, but this medication has also been on hold for last 3 weeks. -Hypoglycemic protocols -Metformin held -Hemoglobin A1c was noted to be 6.2 -CBGs before every meal with sensitive SSI  Essential Hypertension: On admission blood pressures 171/83.  Home blood pressure medications included losartan 25 mg daily and metoprolol 25 mg twice daily.  Furosemide 20 mg daily had been on hold for 3 weeks, but reason is not totally clear -Continue losartan and metoprolol  Normocytic anemia: Acute.  Hemoglobin 11 g/dL, but previously have been within normal limits in July at 13 g/dL.  Patient denies any complaints of blood in stools -Recheck CBC tomorrow morning  Depression -Continue Wellbutrin and Cymbalta  Back/neck pain: Chronic.   Patient has history of cervical myelopathy and chronic low back pain.  She had been advised to hold Plavix 7 days prior to TLESLI bilateral L5-S1 procedure on 7/20 and per review of records.  Patient has been holding medication for the last 3 weeks. -Continue outpatient follow-up with neurology  Hyperlipidemia -Continue Atorvastatin  OSA on CPAP -Continue CPAP per RT  GERD/gastritis -Continue Protonix and Carafate  Morbid obesity: BMI 40.22 kg/m  DVT prophylaxis: Lovenox Code Status: Full Family Communication: Husband updated at bedside Disposition Plan: Home Consults called: None Admission status: Inpatient   Norval Morton MD [facility]   If 7PM-7AM, please contact night-coverage   03/02/2021, 3:13 PM

## 2021-03-02 NOTE — ED Notes (Signed)
Report called to Alda Lea, RN on inpatient unit.

## 2021-03-02 NOTE — ED Provider Notes (Signed)
[facility] Provider Note   CSN: [id number] Arrival date & time: 03/02/21  1020     History Chief Complaint  Patient presents with   Shortness of Breath    [name] is a 60 y.o. female.  HPI  60 year old female with past medical history of HTN, DM, HLD presents to emergency department with concern for shortness of breath.  This been going on for the past week, worsening over the past couple days.  She endorses orthopnea and dyspnea on overexertion.  As well as mild lower extremity edema.  No known cardiac history or history of heart failure.  She has an intermittent cough that is nonproductive.  Denies any fever or other infectious symptoms.  Past Medical History:  Diagnosis Date   Aortic valve calcification    thickened   Arthritis    Chronic back pain    stenosis   Depression    takes Lexapro daily   Diabetes mellitus (HCC)    Headache    History of bronchitis 2 yrs ago   History of colon polyps    benign   Hyperlipidemia    Hypertension    takes Lisinopril and HCTZ daily   Insomnia    takes Xanax at bedtime   Murmur, cardiac    Nocturia    OSA on CPAP    PONV (postoperative nausea and vomiting)    Urinary frequency    takes Ditropan nightly   Urinary urgency    Vertigo    hx of that required Meclizine    Weakness    numbness and tingling in both legs and occasionally in the left    Patient Active Problem List   Diagnosis Date Noted   Acute exacerbation of CHF (congestive heart failure) ([place]) 03/02/2021   Abnormal nuclear stress test    Spinal stenosis, lumbar region, with neurogenic claudication 03/04/2015    Past Surgical History:  Procedure Laterality Date   ABDOMINAL HYSTERECTOMY  1998   BACK SURGERY     bladder tack     x 2    BREAST CYST ASPIRATION Right    CARDIAC CATHETERIZATION N/A 06/23/2016   Procedure: Left Heart Cath and Coronary Angiography;  Surgeon: Belva Crome, MD;  Location: [facility];  Service: Cardiovascular;  Laterality: N/A;   CERVICAL FUSION     CHOLECYSTECTOMY     COLONOSCOPY       OB History   No obstetric history on file.     Family History  Problem Relation Age of Onset   Hypertension Father    Cerebrovascular Disease Father    Bladder Cancer Father    Colon cancer Mother    Hypertension Mother    Hyperlipidemia Mother    Atrial fibrillation Mother    Kidney cancer Brother    Hypertension Brother    Diabetes Brother    Diabetes Brother    Cerebrovascular Disease Brother    Atrial fibrillation Brother    Hypertension Sister    Diabetes Sister    Sleep apnea Other        siblings   Breast cancer Neg Hx     Social History   Tobacco Use   Smoking status: Former   Smokeless tobacco: Never   Tobacco comments:    quit smoking 20+yrs ago  Substance Use Topics   Alcohol use: No   Drug use: No    Home Medications Prior to Admission medications   Medication Sig Start Date End  Date Taking? Authorizing Provider  sucralfate (CARAFATE) 1 GM/10ML suspension Take 1 g by mouth 4 (four) times daily -  with meals and at bedtime.   Yes [provider]  acetaminophen (TYLENOL) 500 MG tablet Take 500 mg by mouth every 6 (six) hours as needed for mild pain or headache.     [provider]  acetaminophen-codeine (TYLENOL #3) 300-30 MG tablet Take 1 tablet by mouth every 4 (four) hours as needed for moderate pain.  05/31/16   [provider]  ALPRAZolam Duanne Moron) 0.5 MG tablet Take 0.5 mg by mouth at bedtime.    [provider]  amitriptyline (ELAVIL) 25 MG tablet Take 25 mg by mouth at bedtime.    [provider]  aspirin 81 MG tablet Take 81 mg by mouth daily.    [provider]  atorvastatin (LIPITOR) 10 MG tablet Take 10 mg by mouth daily.    [provider]  carbamazepine (TEGRETOL) 200 MG tablet Take 400 mg by mouth 3 (three) times daily.     [provider]  cholecalciferol  (VITAMIN D) 1000 UNITS tablet Take 1,000 Units by mouth daily.    [provider]  escitalopram (LEXAPRO) 10 MG tablet Take 20 mg by mouth daily.    [provider]  fish oil-omega-3 fatty acids 1000 MG capsule Take 1 g by mouth daily.     [provider]  lisinopril-hydrochlorothiazide (PRINZIDE,ZESTORETIC) 10-12.5 MG tablet Take 1 tablet by mouth daily.    [provider]  metFORMIN (GLUCOPHAGE-XR) 500 MG 24 hr tablet Take 500 mg by mouth daily with breakfast.    [provider]  metoprolol tartrate (LOPRESSOR) 25 MG tablet Take 1 tablet (25 mg total) by mouth 2 (two) times daily. 08/11/16 12/21/18  Barrett, Evelene Croon, PA-C  omeprazole (PRILOSEC) 20 MG capsule Take 1 capsule (20 mg total) by mouth daily. 02/02/21   Hayden Rasmussen, MD  tolterodine (DETROL LA) 4 MG 24 hr capsule Take 4 mg by mouth daily.    [provider]    Allergies    Patient has no known allergies.  Review of Systems   Review of Systems  Constitutional:  Positive for fatigue. Negative for chills and fever.  HENT:  Negative for congestion.   Eyes:  Negative for visual disturbance.  Respiratory:  Positive for chest tightness and shortness of breath.   Cardiovascular:  Positive for leg swelling. Negative for chest pain and palpitations.  Gastrointestinal:  Negative for abdominal pain, diarrhea and vomiting.  Genitourinary:  Negative for dysuria.  Skin:  Negative for rash.  Neurological:  Negative for headaches.   Physical Exam Updated Vital Signs BP (!) 152/97   Pulse 74   Temp 99.1 F (37.3 C) (Oral)   Resp (!) 24   Ht '5\' 4"'$  (1.626 m)   Wt 104.3 kg   SpO2 99%   BMI 39.48 kg/m   Physical Exam Vitals and nursing note reviewed.  Constitutional:      Appearance: Normal appearance. She is not diaphoretic.  HENT:     Head: Normocephalic.     Mouth/Throat:     Mouth: Mucous membranes are moist.  Cardiovascular:     Rate and Rhythm: Normal rate.   Pulmonary:     Effort: Pulmonary effort is normal. Tachypnea present. No accessory muscle usage or respiratory distress.     Breath sounds: Examination of the right-lower field reveals decreased breath sounds. Examination of the left-lower field reveals decreased breath sounds.  Decreased breath sounds and rales present.  Abdominal:     Palpations: Abdomen is soft.     Tenderness: There is no abdominal tenderness.  Musculoskeletal:     Right lower leg: Edema present.     Left lower leg: Edema present.  Skin:    General: Skin is warm.  Neurological:     Mental Status: She is alert and oriented to person, place, and time. Mental status is at baseline.  Psychiatric:        Mood and Affect: Mood normal.    ED Results / Procedures / Treatments   Labs (all labs ordered are listed, but only abnormal results are displayed) Labs Reviewed  CBC WITH DIFFERENTIAL/PLATELET - Abnormal; Notable for the following components:      Result Value   RBC 3.38 (*)    Hemoglobin 11.0 (*)    HCT 31.6 (*)    All other components within normal limits  BASIC METABOLIC PANEL - Abnormal; Notable for the following components:   Sodium 131 (*)    Chloride 95 (*)    Glucose, Bld 140 (*)    All other components within normal limits  BRAIN NATRIURETIC PEPTIDE - Abnormal; Notable for the following components:   B Natriuretic Peptide 385.0 (*)    All other components within normal limits  RESP PANEL BY RT-PCR (FLU A&B, COVID) ARPGX2  TROPONIN I (HIGH SENSITIVITY)  TROPONIN I (HIGH SENSITIVITY)    EKG EKG Interpretation  Date/Time:  Sunday March 02 2021 10:35:56 EDT Ventricular Rate:  74 PR Interval:  189 QRS Duration: 90 QT Interval:  382 QTC Calculation: 424 R Axis:   93 Text Interpretation: Sinus rhythm Borderline right axis deviation Low voltage, precordial leads NSR Confirmed by Lavenia Atlas [phone]) on 03/02/2021 11:37:25 AM  Radiology DG Chest 2 View  Result Date: 03/02/2021 CLINICAL DATA:   Shortness of breath in a 60 year old female. EXAM: CHEST - 2 VIEW COMPARISON:  Comparison made with February 02, 2021. FINDINGS: Trachea is midline. Cardiomediastinal contours are stable accounting for portable AP projection. Bilateral interstitial and alveolar opacities with central predominance and asymmetry are noted. Small effusions are present. No visible pneumothorax. On limited assessment there is no acute skeletal process. IMPRESSION: Bilateral interstitial and alveolar opacities with central predominance and asymmetry, could reflect asymmetric edema or atypical infection. Pattern could also be seen in the setting of pulmonary hemorrhage. Small bilateral pleural effusions. Electronically Signed   By: Zetta Bills M.D.   On: 03/02/2021 11:27    Procedures .Critical Care  Date/Time: 03/02/2021 1:39 PM Performed by: Lorelle Gibbs, DO Authorized by: Lorelle Gibbs, DO   Critical care provider statement:    Critical care time (minutes):  45   Critical care time was exclusive of:  Separately billable procedures and treating other patients   Critical care was necessary to treat or prevent imminent or life-threatening deterioration of the following conditions:  Respiratory failure and cardiac failure   Critical care was time spent personally by me on the following activities:  Discussions with consultants, evaluation of patient's response to treatment, examination of patient, ordering and performing treatments and interventions, ordering and review of laboratory studies, ordering and review of radiographic studies, pulse oximetry, re-evaluation of patient's condition, obtaining history from patient or surrogate and review of old charts   I assumed direction of critical care for this patient from another provider in my specialty: no     Care discussed with: admitting provider     Medications Ordered  in ED Medications  furosemide (LASIX) injection 40 mg (40 mg Intravenous Given 03/02/21 1240)     ED Course  I have reviewed the triage vital signs and the nursing notes.  Pertinent labs & imaging results that were available during my care of the patient were reviewed by me and considered in my medical decision making (see chart for details).    MDM Rules/Calculators/A&P                           60 year old female presents emergency department for worsening shortness of breath.  She is tachypneic on arrival, hypoxic to the high 80s on room air.  Placed on 2 L nasal cannula with improvement.  Continues to appear short of breath.  Chest x-ray shows findings consistent with pulmonary congestion/edema.  Blood work is baseline, EKG shows no ischemic changes, troponin is negative.  BNP is slightly elevated in the 300s.  Patient will be diuresed and admitted for hypoxia secondary to pulmonary edema.  Last echo/cardiac work-up was in 2017.  Patients evaluation and results requires admission for further treatment and care. Patient agrees with admission plan, offers no new complaints and is stable/unchanged at time of admit.  Final Clinical Impression(s) / ED Diagnoses Final diagnoses:  Hypoxia  Acute pulmonary edema [facility])    Rx / DC Orders ED Discharge Orders     None        Lorelle Gibbs, DO 03/02/21 1339

## 2021-03-03 ENCOUNTER — Inpatient Hospital Stay (HOSPITAL_COMMUNITY): Payer: Medicare Other

## 2021-03-03 ENCOUNTER — Encounter (HOSPITAL_COMMUNITY): Payer: Self-pay | Admitting: Internal Medicine

## 2021-03-03 DIAGNOSIS — I5040 Unspecified combined systolic (congestive) and diastolic (congestive) heart failure: Secondary | ICD-10-CM

## 2021-03-03 DIAGNOSIS — I5033 Acute on chronic diastolic (congestive) heart failure: Secondary | ICD-10-CM | POA: Diagnosis not present

## 2021-03-03 LAB — ECHOCARDIOGRAM COMPLETE
Area-P 1/2: 3.48 cm2
Calc EF: 73.6 %
Height: 64 in
S' Lateral: 2.5 cm
Single Plane A2C EF: 71.6 %
Single Plane A4C EF: 75 %
Weight: 3665.6 oz

## 2021-03-03 LAB — BASIC METABOLIC PANEL
Anion gap: 13 (ref 5–15)
BUN: 8 mg/dL (ref 6–20)
CO2: 32 mmol/L (ref 22–32)
Calcium: 9.5 mg/dL (ref 8.9–10.3)
Chloride: 91 mmol/L — ABNORMAL LOW (ref 98–111)
Creatinine, Ser: 0.89 mg/dL (ref 0.44–1.00)
GFR, Estimated: >60 mL/min (ref >60)
Glucose, Bld: 128 mg/dL — ABNORMAL HIGH (ref 70–99)
Potassium: 3.6 mmol/L (ref 3.5–5.1)
Sodium: 136 mmol/L (ref 135–145)

## 2021-03-03 LAB — GLUCOSE, CAPILLARY
Glucose-Capillary: 131 mg/dL — ABNORMAL HIGH (ref 70–99)
Glucose-Capillary: 171 mg/dL — ABNORMAL HIGH (ref 70–99)
Glucose-Capillary: 154 mg/dL — ABNORMAL HIGH (ref 70–99)
Glucose-Capillary: 138 mg/dL — ABNORMAL HIGH (ref 70–99)

## 2021-03-03 NOTE — Progress Notes (Signed)
  Echocardiogram 2D Echocardiogram has been performed.  [name] 03/03/2021, 11:35 AM

## 2021-03-03 NOTE — Progress Notes (Signed)
Patient refused cpap at this time. Patient does not want to wear during stay,

## 2021-03-03 NOTE — Progress Notes (Signed)
PROGRESS NOTE   [name]  [id number] DOB: Oct 24, 1960 DOA: 03/02/2021 PCP: Jamie Kato  Brief Narrative:  60 year old community dwelling white female known L5 spinal stenosis status post surgery 2015, HTN, presumed HFpEF, reflux, HTN  2 weeks ago went to PCP was DC off medications including Lasix because of severe nausea vomiting Never has had an echo Admitted with shortness of breath and found to have decompensated heart failure requiring oxygen was 87% needing 2 L BNP 385 CXR possible infiltrates  Hospital-Problem based course  Decompensated likely diastolic heart failure Secondary to known Lasix-diuresis Lasix 40 IV twice dailyBaseline weight seems to be about 105, continue Cozaar 25, metoprolol 25 twice daily at this time continue standing weights Hypervolemic hyponatremia 2/2 decompensated HF-improved DM TY 2 Currently off metformin A1c 6.2 Continue SSI at this time Depression Continue Tegretol 600 twice daily Robaxin-750 3 times daily as needed, bupropion 300 a.m., Cymbalta 60 twice daily and trazodone 200 at bedtime Watch mentation closely OSA on CPAP Try to resume in the outpatient setting-unable to tolerate here so may need oxygen when sleeping Chronic back neck pain ?  Procedure planned in the near future? Will need further discussion in the outpatient setting Recent severe gastritis and reflux Continue Carafate 1 g 3 times daily tablet, Protonix 40 twice daily    DVT prophylaxis: Lovenox Code Status: Full presumed Family Communication: None Disposition:  Status is: Inpatient  Remains inpatient appropriate because:Persistent severe electrolyte disturbances, Ongoing active pain requiring inpatient pain management, and IV treatments appropriate due to intensity of illness or inability to take PO  Dispo: The patient is from: Home              Anticipated d/c is to: Home              Patient currently is not medically stable to d/c.    Difficult to place patient No       Consultants:  None currently  Procedures: None  Antimicrobials: No   Subjective: Awake alert coherent no distress Feels little bit more comfortable still feels some shortness of breath although I do not know how much of this is her baseline No chest pain Has not been out of bed  Objective: Vitals:   03/03/21 0533 03/03/21 0534 03/03/21 0535 03/03/21 0803  BP: (!) 103/48 (!) 103/48  (!) 123/55  Pulse: 76 80 76 72  Resp:  18  18  Temp:  98.7 F (37.1 C)  98.8 F (37.1 C)  TempSrc:  Oral  Oral  SpO2: 94% 100% (!) 87% 94%  Weight:  103.9 kg    Height:        Intake/Output Summary (Last 24 hours) at 03/03/2021 1001 Last data filed at 03/02/2021 1530 Gross per 24 hour  Intake 240 ml  Output 1 ml  Net 239 ml   Filed Weights   03/02/21 1034 03/02/21 1534 03/03/21 0534  Weight: 104.3 kg 106.3 kg 103.9 kg    Examination:  Awake coherent no distress EOMI NCAT no focal deficit CTA B no rales no rhonchi JVD cannot appreciate Mild lower extremity edema ROM intact neck soft supple No specific LAM Power 5/5 reflexes deferred Psych euthymic slightly flat affect  Data Reviewed: personally reviewed   CBC    Component Value Date/Time   WBC 7.9 03/02/2021 1057   RBC 3.38 (L) 03/02/2021 1057   HGB 11.0 (L) 03/02/2021 1057   HCT 31.6 (L) 03/02/2021 1057   PLT 214 03/02/2021 1057  MCV 93.5 03/02/2021 1057   MCH 32.5 03/02/2021 1057   MCHC 34.8 03/02/2021 1057   RDW 12.0 03/02/2021 1057   LYMPHSABS 1.0 03/02/2021 1057   MONOABS 0.8 03/02/2021 1057   EOSABS 0.1 03/02/2021 1057   BASOSABS 0.0 03/02/2021 1057   CMP Latest Ref Rng & Units 03/03/2021 03/02/2021 02/02/2021  Glucose 70 - 99 mg/dL 128(H) 140(H) 125(H)  BUN 6 - 20 mg/dL '8 7 17  '$ Creatinine 0.44 - 1.00 mg/dL 0.89 0.72 0.82  Sodium 135 - 145 mmol/L 136 131(L) 137  Potassium 3.5 - 5.1 mmol/L 3.6 4.0 4.5  Chloride 98 - 111 mmol/L 91(L) 95(L) 98  CO2 22 - 32 mmol/L 32 28 28   Calcium 8.9 - 10.3 mg/dL 9.5 9.0 9.7  Total Protein 6.5 - 8.1 g/dL - - 7.4  Total Bilirubin 0.3 - 1.2 mg/dL - - 0.4  Alkaline Phos 38 - 126 U/L - - 85  AST 15 - 41 U/L - - 26  ALT 0 - 44 U/L - - 27     Radiology Studies: DG Chest 2 View  Result Date: 03/02/2021 CLINICAL DATA:  Shortness of breath in a 60 year old female. EXAM: CHEST - 2 VIEW COMPARISON:  Comparison made with February 02, 2021. FINDINGS: Trachea is midline. Cardiomediastinal contours are stable accounting for portable AP projection. Bilateral interstitial and alveolar opacities with central predominance and asymmetry are noted. Small effusions are present. No visible pneumothorax. On limited assessment there is no acute skeletal process. IMPRESSION: Bilateral interstitial and alveolar opacities with central predominance and asymmetry, could reflect asymmetric edema or atypical infection. Pattern could also be seen in the setting of pulmonary hemorrhage. Small bilateral pleural effusions. Electronically Signed   By: Zetta Bills M.D.   On: 03/02/2021 11:27     Scheduled Meds:  atorvastatin  10 mg Oral Daily   buPROPion  300 mg Oral q morning   carbamazepine  600 mg Oral BID   DULoxetine  60 mg Oral BID   enoxaparin (LOVENOX) injection  40 mg Subcutaneous Q24H   furosemide  40 mg Intravenous BID   insulin aspart  0-9 Units Subcutaneous TID WC   losartan  25 mg Oral q morning   metoprolol tartrate  25 mg Oral BID   pantoprazole  40 mg Oral BID   sodium chloride flush  3 mL Intravenous Q12H   sucralfate  1 g Oral TID   traZODone  200 mg Oral QHS   Continuous Infusions:  sodium chloride       LOS: 1 day   Time spent: [place], MD [facility] To contact the attending provider between 7A-7P or the covering provider during after hours 7P-7A, please log into the web site [email or web address] and access using universal [place] password for that web site. If you do not have the password, please call the  hospital operator.  03/03/2021, 10:01 AM

## 2021-03-03 NOTE — Progress Notes (Signed)
Heart Failure Navigator Progress Note  Assessed for Heart & Vascular TOC clinic readiness.  Pt follows with [facility] cardiology per patient statement. Would like to continue after discharge from hospitalization.   Education Assessment and Provision:  Detailed education and instructions provided on heart failure disease management including the following:  Signs and symptoms of Heart Failure When to call the physician Importance of daily weights Low sodium diet Fluid restriction Medication management Anticipated future follow-up appointments  Patient education given on each of the above topics.  Patient acknowledges understanding via teach back method and acceptance of all instructions.  Education Materials:  "Living Better With Heart Failure" Booklet, HF zone tool, & Daily Weight Tracker Tool.  Patient has scale at home: yes Patient has pill box at home: yes  Navigator available for reassessment of patient.   Darlene Holm, MSN, RN Heart Failure Nurse Navigator [phone]

## 2021-03-04 ENCOUNTER — Other Ambulatory Visit (HOSPITAL_COMMUNITY): Payer: Self-pay

## 2021-03-04 DIAGNOSIS — I5033 Acute on chronic diastolic (congestive) heart failure: Secondary | ICD-10-CM | POA: Diagnosis not present

## 2021-03-04 LAB — GLUCOSE, CAPILLARY
Glucose-Capillary: 125 mg/dL — ABNORMAL HIGH (ref 70–99)
Glucose-Capillary: 177 mg/dL — ABNORMAL HIGH (ref 70–99)
Glucose-Capillary: 110 mg/dL — ABNORMAL HIGH (ref 70–99)
Glucose-Capillary: 143 mg/dL — ABNORMAL HIGH (ref 70–99)

## 2021-03-04 LAB — COMPREHENSIVE METABOLIC PANEL
ALT: 15 U/L (ref 0–44)
AST: 15 U/L (ref 15–41)
Albumin: 3.2 g/dL — ABNORMAL LOW (ref 3.5–5.0)
Alkaline Phosphatase: 78 U/L (ref 38–126)
Anion gap: 11 (ref 5–15)
BUN: 16 mg/dL (ref 6–20)
CO2: 32 mmol/L (ref 22–32)
Calcium: 9.3 mg/dL (ref 8.9–10.3)
Chloride: 90 mmol/L — ABNORMAL LOW (ref 98–111)
Creatinine, Ser: 1 mg/dL (ref 0.44–1.00)
GFR, Estimated: >60 mL/min (ref >60)
Glucose, Bld: 126 mg/dL — ABNORMAL HIGH (ref 70–99)
Potassium: 3.1 mmol/L — ABNORMAL LOW (ref 3.5–5.1)
Sodium: 133 mmol/L — ABNORMAL LOW (ref 135–145)
Total Bilirubin: 0.9 mg/dL (ref 0.3–1.2)
Total Protein: 6.6 g/dL (ref 6.5–8.1)

## 2021-03-04 MED ORDER — POTASSIUM CHLORIDE CRYS ER 20 MEQ PO TBCR
40.0000 meq | EXTENDED_RELEASE_TABLET | Freq: Every day | ORAL | Status: DC
  Administered 2021-03-04 – 2021-03-06 (×3): 40 meq via ORAL
  Filled 2021-03-04 (×3): qty 2

## 2021-03-04 NOTE — Progress Notes (Addendum)
PROGRESS NOTE   [name]  [id number] DOB: 04/01/1961 DOA: 03/02/2021 PCP: [name]  Brief Narrative:  60 year old community dwelling white female known L5 spinal stenosis status post surgery 2015, HTN, presumed HFpEF, reflux, HTN  2 weeks ago went to PCP was DC off medications including Lasix because of severe nausea vomiting Never has had an echo Admitted with shortness of breath and found to have decompensated heart failure requiring oxygen was 87% needing 2 L BNP 385 CXR possible infiltrates  Hospital-Problem based course  Decompensated likely diastolic heart failure continue Lasix-diuresis 40 IV twice daily Baseline weight seems to be about 105--currently 102.  net weight even today Likely can convert to orals in a.m. and possible discharge Thursday or Friday if continues to maintain  net neg fluid status continue Cozaar 25, metoprolol 25 twice daily at this time continue standing weights Hypervolemic hyponatremia 2/2 decompensated HF-transiently low--monitor Hypokalemia Replace with Kdur 40 qd DM TY 2 Currently off metformin A1c 6.2--resume in the next several days or at discharge Continue SSI--cbg good at 120-180 Depression Continue Tegretol 600 twice daily Robaxin-750 3 times daily as needed, bupropion 300 a.m., Cymbalta 60 twice daily and trazodone 200 at bedtime Watch mentation closely OSA on CPAP Try to resume in the outpatient setting-unable to tolerate here so may need oxygen when sleeping Chronic back neck pain ?  Procedure planned in the near future? Will need further discussion in the outpatient setting--cont tegretol 600 bid, robaxin 750 tid Recent severe gastritis and reflux Continue Carafate 1 g 3 times daily tablet, Protonix 40 twice daily    DVT prophylaxis: Lovenox Code Status: Full presumed Family Communication: None Disposition:  Status is: Inpatient  Remains inpatient appropriate because:Persistent severe electrolyte  disturbances, Ongoing active pain requiring inpatient pain management, and IV treatments appropriate due to intensity of illness or inability to take PO  Dispo: The patient is from: Home              Anticipated d/c is to: Home in the next 2 to 3 days when on p.o. diuretics               Patient currently is not medically stable to d/c.   Difficult to place patient No     Consultants:  None currently  Procedures:   Echo 8/22 IMPRESSIONS    1. Left ventricular ejection fraction, by estimation, is 65 to 70%. The  left ventricle has normal function. The left ventricle has no regional  wall motion abnormalities. There is moderate asymmetric left ventricular  hypertrophy of the basal-septal  segment. Left ventricular diastolic parameters are indeterminate.   2. Right ventricular systolic function is normal. The right ventricular  size is normal. Tricuspid regurgitation signal is inadequate for assessing  PA pressure.   3. The mitral valve is normal in structure. Trivial mitral valve  regurgitation.   4. The aortic valve was not well visualized. Aortic valve regurgitation  is mild. No aortic stenosis is present.   5. Aortic dilatation noted. There is mild dilatation of the ascending  aorta, measuring 38 mm.   6. The inferior vena cava is normal in size with greater than 50%  respiratory variability, suggesting right atrial pressure of 3 mmHg.   Antimicrobials: No   Subjective:  Looks comfortable no distress feels less short of breath mild discomfort in chest at times no nausea no vomiting no fever no chills Has not walked yet on the unit passing good urine  Objective: Vitals:  03/03/21 1156 03/03/21 2052 03/04/21 0709 03/04/21 1045  BP: (!) 144/65 (!) 120/56  126/68  Pulse: 73 72  71  Resp: 20 17    Temp: 98.4 F (36.9 C) 98.7 F (37.1 C)    TempSrc: Oral Oral    SpO2: 92% 91%    Weight:   102.7 kg   Height:        Intake/Output Summary (Last 24 hours) at 03/04/2021  1145 Last data filed at 03/04/2021 1051 Gross per 24 hour  Intake 776 ml  Output 1000 ml  Net -224 ml    Filed Weights   03/02/21 1534 03/03/21 0534 03/04/21 0709  Weight: 106.3 kg 103.9 kg 102.7 kg    Examination:  Thick neck Mallampati 4 hirsute CTA B no rales no rhonchi JVD cannot appreciate given habitus No lower extremity edema improved No icterus no pallor No specific LAN Has scar in lower lumbar region Power 5/5 reflexes deferred Psych euthymic slightly flat affect  Data Reviewed: personally reviewed   CBC    Component Value Date/Time   WBC 7.9 03/02/2021 1057   RBC 3.38 (L) 03/02/2021 1057   HGB 11.0 (L) 03/02/2021 1057   HCT 31.6 (L) 03/02/2021 1057   PLT 214 03/02/2021 1057   MCV 93.5 03/02/2021 1057   MCH 32.5 03/02/2021 1057   MCHC 34.8 03/02/2021 1057   RDW 12.0 03/02/2021 1057   LYMPHSABS 1.0 03/02/2021 1057   MONOABS 0.8 03/02/2021 1057   EOSABS 0.1 03/02/2021 1057   BASOSABS 0.0 03/02/2021 1057   CMP Latest Ref Rng & Units 03/04/2021 03/03/2021 03/02/2021  Glucose 70 - 99 mg/dL 126(H) 128(H) 140(H)  BUN 6 - 20 mg/dL '16 8 7  '$ Creatinine 0.44 - 1.00 mg/dL 1.00 0.89 0.72  Sodium 135 - 145 mmol/L 133(L) 136 131(L)  Potassium 3.5 - 5.1 mmol/L 3.1(L) 3.6 4.0  Chloride 98 - 111 mmol/L 90(L) 91(L) 95(L)  CO2 22 - 32 mmol/L 32 32 28  Calcium 8.9 - 10.3 mg/dL 9.3 9.5 9.0  Total Protein 6.5 - 8.1 g/dL 6.6 - -  Total Bilirubin 0.3 - 1.2 mg/dL 0.9 - -  Alkaline Phos 38 - 126 U/L 78 - -  AST 15 - 41 U/L 15 - -  ALT 0 - 44 U/L 15 - -     Radiology Studies: ECHOCARDIOGRAM COMPLETE  Result Date: 03/03/2021    ECHOCARDIOGRAM REPORT   Patient Name:   [name] Date of Exam: 03/03/2021 Medical Rec #:  [id number]           Height:       64.0 in Accession #:    [id number]          Weight:       229.1 lb Date of Birth:  [birth year]           BSA:          2.073 m Patient Age:    77 years            BP:           123/55 mmHg Patient Gender: F                    HR:           70 bpm. Exam Location:  Inpatient Procedure: 2D Echo, Cardiac Doppler, Color Doppler and 3D Echo Indications:    I50.40* Unspecified combined systolic (congestive) and diastolic                 (  congestive) heart failure  History:        Patient has prior history of Echocardiogram examinations, most                 recent 06/03/2016. CHF, Signs/Symptoms:Murmur; Risk                 Factors:Hypertension, Dyslipidemia and Diabetes. Hypoxia.  Sonographer:    Roseanna Rainbow RDCS Referring Phys: [id number] [facility] A SMITH  Sonographer Comments: Image acquisition challenging due to patient body habitus. IMPRESSIONS  1. Left ventricular ejection fraction, by estimation, is 65 to 70%. The left ventricle has normal function. The left ventricle has no regional wall motion abnormalities. There is moderate asymmetric left ventricular hypertrophy of the basal-septal segment. Left ventricular diastolic parameters are indeterminate.  2. Right ventricular systolic function is normal. The right ventricular size is normal. Tricuspid regurgitation signal is inadequate for assessing PA pressure.  3. The mitral valve is normal in structure. Trivial mitral valve regurgitation.  4. The aortic valve was not well visualized. Aortic valve regurgitation is mild. No aortic stenosis is present.  5. Aortic dilatation noted. There is mild dilatation of the ascending aorta, measuring 38 mm.  6. The inferior vena cava is normal in size with greater than 50% respiratory variability, suggesting right atrial pressure of 3 mmHg. FINDINGS  Left Ventricle: Left ventricular ejection fraction, by estimation, is 65 to 70%. The left ventricle has normal function. The left ventricle has no regional wall motion abnormalities. The left ventricular internal cavity size was normal in size. There is  moderate asymmetric left ventricular hypertrophy of the basal-septal segment. Left ventricular diastolic parameters are indeterminate. Right Ventricle: The  right ventricular size is normal. No increase in right ventricular wall thickness. Right ventricular systolic function is normal. Tricuspid regurgitation signal is inadequate for assessing PA pressure. Left Atrium: Left atrial size was normal in size. Right Atrium: Right atrial size was normal in size. Pericardium: Trivial pericardial effusion is present. Mitral Valve: The mitral valve is normal in structure. Trivial mitral valve regurgitation. Tricuspid Valve: The tricuspid valve is normal in structure. Tricuspid valve regurgitation is trivial. Aortic Valve: The aortic valve was not well visualized. Aortic valve regurgitation is mild. No aortic stenosis is present. Pulmonic Valve: The pulmonic valve was not well visualized. Pulmonic valve regurgitation is trivial. Aorta: The aortic root is normal in size and structure and aortic dilatation noted. There is mild dilatation of the ascending aorta, measuring 38 mm. Venous: The inferior vena cava is normal in size with greater than 50% respiratory variability, suggesting right atrial pressure of 3 mmHg. IAS/Shunts: The interatrial septum was not well visualized.  LEFT VENTRICLE PLAX 2D LVIDd:         4.20 cm     Diastology LVIDs:         2.50 cm     LV e' medial:    5.87 cm/s LV PW:         1.10 cm     LV E/e' medial:  18.7 LV IVS:        1.40 cm     LV e' lateral:   6.09 cm/s LVOT diam:     1.70 cm     LV E/e' lateral: 18.1 LV SV:         69 LV SV Index:   33 LVOT Area:     2.27 cm  3D Volume EF: LV Volumes (MOD)           3D EF:        62 % LV vol d, MOD A2C: 53.9 ml LV EDV:       74 ml LV vol d, MOD A4C: 51.4 ml LV ESV:       28 ml LV vol s, MOD A2C: 15.3 ml LV SV:        46 ml LV vol s, MOD A4C: 12.8 ml LV SV MOD A2C:     38.6 ml LV SV MOD A4C:     51.4 ml LV SV MOD BP:      39.2 ml RIGHT VENTRICLE             IVC RV S prime:     18.40 cm/s  IVC diam: 1.60 cm TAPSE (M-mode): 2.2 cm LEFT ATRIUM             Index       RIGHT ATRIUM            Index LA diam:        3.60 cm 1.74 cm/m  RA Area:     10.40 cm LA Vol (A2C):   34.6 ml 16.69 ml/m RA Volume:   20.50 ml  9.89 ml/m LA Vol (A4C):   50.2 ml 24.22 ml/m LA Biplane Vol: 45.1 ml 21.76 ml/m  AORTIC VALVE             PULMONIC VALVE LVOT Vmax:   141.00 cm/s PR End Diast Vel: 1.93 msec LVOT Vmean:  96.800 cm/s LVOT VTI:    0.302 m  AORTA Ao Root diam: 2.80 cm Ao Asc diam:  3.80 cm MITRAL VALVE MV Area (PHT): 3.48 cm     SHUNTS MV Decel Time: 218 msec     Systemic VTI:  0.30 m MV E velocity: 110.00 cm/s  Systemic Diam: 1.70 cm MV A velocity: 120.00 cm/s MV E/A ratio:  0.92 Oswaldo Milian MD Electronically signed by Oswaldo Milian MD Signature Date/Time: 03/03/2021/3:45:19 PM    Final      Scheduled Meds:  buPROPion  300 mg Oral q morning   carbamazepine  600 mg Oral BID   DULoxetine  60 mg Oral BID   enoxaparin (LOVENOX) injection  40 mg Subcutaneous Q24H   furosemide  40 mg Intravenous BID   insulin aspart  0-9 Units Subcutaneous TID WC   losartan  25 mg Oral q morning   metoprolol tartrate  25 mg Oral BID   pantoprazole  40 mg Oral BID   potassium chloride  40 mEq Oral Daily   sodium chloride flush  3 mL Intravenous Q12H   sucralfate  1 g Oral TID   traZODone  200 mg Oral QHS   Continuous Infusions:  sodium chloride       LOS: 2 days   Time spent: 50  Nita Sells, MD [facility] To contact the attending provider between 7A-7P or the covering provider during after hours 7P-7A, please log into the web site [email or web address] and access using universal [place] password for that web site. If you do not have the password, please call the hospital operator.  03/04/2021, 11:45 AM

## 2021-03-04 NOTE — Progress Notes (Signed)
Heart Failure Stewardship Pharmacist Progress Note   PCP: Ramiro Harvest, PA-C PCP-Cardiologist: None    HPI:  60 yo F with HTN, HFpEF, HLD, depression, and T2DM. She presented to the ED on 8/21 with shortness of breath, intermittent palpitations, weight gain and orthopnea. Reports she was advised to stop taking lasix within the last month. CXR on admission with edema vs infection. An ECHO was done on 8/22 and LVEF is 65-70% (stable from prior in 05/2016) with moderate LVH. Last LHC in 2017 without significant CAD (30% stenosis in LAD beyond the first diagonal).   Current HF Medications: Furosemide 40 mg IV BID Metoprolol tartrate 25 mg BID Losartan 25 mg daily  Prior to admission HF Medications: Furosemide 20 mg daily (one dose within the last month) Metoprolol tartrate 25 mg BID Losartan 25 mg daily  Pertinent Lab Values: Serum creatinine 1.00, BUN 16, Potassium 3.1, Sodium 133, BNP 385 A1c 6.2  Vital Signs: Weight: 226 lbs (admission weight: 234 lbs) Blood pressure: 130/60s  Heart rate: 70s   Medication Assistance / Insurance Benefits Check: Does the patient have prescription insurance?  Yes Type of insurance plan: UHC Medicare  Does the patient qualify for medication assistance through manufacturers or grants?   Pending household income info Eligible grants and/or patient assistance programs: pending Medication assistance applications in progress: none  Medication assistance applications approved: none Approved medication assistance renewals will be completed by: pending  Outpatient Pharmacy:  Prior to admission [facility] Is the patient willing to use [place] at discharge? Yes Is the patient willing to transition their outpatient pharmacy to utilize a [facility] outpatient pharmacy?   Pending    Assessment: 1. Acute on chronic diastolic CHF (EF [id number]). NYHA class II symptoms. - Continue furosemide 40 mg IV BID. Potassium replacement  ordered - Continue metoprolol tartrate 25 mg BID - Continue losartan 25 mg daily - Consider adding SGLT2i prior to discharge   Plan: 1) Medication changes recommended at this time: - Add Farxiga 10 mg daily  2) Patient assistance: - Farxiga/Jardiance copay $47 per month - Can help enroll in patient assistance if copays are unaffordable  3)  Education  - To be completed prior to discharge  Kerby Nora, PharmD, BCPS Heart Failure Cytogeneticist Phone [phone]

## 2021-03-05 DIAGNOSIS — I5033 Acute on chronic diastolic (congestive) heart failure: Secondary | ICD-10-CM | POA: Diagnosis not present

## 2021-03-05 LAB — COMPREHENSIVE METABOLIC PANEL
ALT: 16 U/L (ref 0–44)
AST: 17 U/L (ref 15–41)
Albumin: 3.3 g/dL — ABNORMAL LOW (ref 3.5–5.0)
Alkaline Phosphatase: 83 U/L (ref 38–126)
Anion gap: 12 (ref 5–15)
BUN: 23 mg/dL — ABNORMAL HIGH (ref 6–20)
CO2: 35 mmol/L — ABNORMAL HIGH (ref 22–32)
Calcium: 9.8 mg/dL (ref 8.9–10.3)
Chloride: 89 mmol/L — ABNORMAL LOW (ref 98–111)
Creatinine, Ser: 1.23 mg/dL — ABNORMAL HIGH (ref 0.44–1.00)
GFR, Estimated: 51 mL/min — ABNORMAL LOW (ref >60)
Glucose, Bld: 133 mg/dL — ABNORMAL HIGH (ref 70–99)
Potassium: 4.3 mmol/L (ref 3.5–5.1)
Sodium: 136 mmol/L (ref 135–145)
Total Bilirubin: 0.8 mg/dL (ref 0.3–1.2)
Total Protein: 7 g/dL (ref 6.5–8.1)

## 2021-03-05 LAB — CBC WITH DIFFERENTIAL/PLATELET
Abs Immature Granulocytes: 0.02 10*3/uL (ref 0.00–0.07)
Basophils Absolute: 0.1 10*3/uL (ref 0.0–0.1)
Basophils Relative: 1 %
Eosinophils Absolute: 0.2 10*3/uL (ref 0.0–0.5)
Eosinophils Relative: 3 %
HCT: 37.2 % (ref 36.0–46.0)
Hemoglobin: 12.8 g/dL (ref 12.0–15.0)
Immature Granulocytes: 0 %
Lymphocytes Relative: 36 %
Lymphs Abs: 3.1 10*3/uL (ref 0.7–4.0)
MCH: 32.4 pg (ref 26.0–34.0)
MCHC: 34.4 g/dL (ref 30.0–36.0)
MCV: 94.2 fL (ref 80.0–100.0)
Monocytes Absolute: 1.1 10*3/uL — ABNORMAL HIGH (ref 0.1–1.0)
Monocytes Relative: 12 %
Neutro Abs: 4.2 10*3/uL (ref 1.7–7.7)
Neutrophils Relative %: 48 %
Platelets: 278 10*3/uL (ref 150–400)
RBC: 3.95 MIL/uL (ref 3.87–5.11)
RDW: 11.8 % (ref 11.5–15.5)
WBC: 8.6 10*3/uL (ref 4.0–10.5)
nRBC: 0 % (ref 0.0–0.2)

## 2021-03-05 LAB — GLUCOSE, CAPILLARY
Glucose-Capillary: 136 mg/dL — ABNORMAL HIGH (ref 70–99)
Glucose-Capillary: 166 mg/dL — ABNORMAL HIGH (ref 70–99)
Glucose-Capillary: 127 mg/dL — ABNORMAL HIGH (ref 70–99)
Glucose-Capillary: 166 mg/dL — ABNORMAL HIGH (ref 70–99)

## 2021-03-05 LAB — MAGNESIUM: Magnesium: 1.7 mg/dL (ref 1.7–2.4)

## 2021-03-05 MED ORDER — FUROSEMIDE 40 MG PO TABS
40.0000 mg | ORAL_TABLET | Freq: Every day | ORAL | Status: DC
  Administered 2021-03-06: 40 mg via ORAL
  Filled 2021-03-05: qty 1

## 2021-03-05 NOTE — Progress Notes (Signed)
Heart Failure Stewardship Pharmacist Progress Note   PCP: Ramiro Harvest, PA-C PCP-Cardiologist: None    HPI:  60 yo F with HTN, HFpEF, HLD, depression, and T2DM. She presented to the ED on 8/21 with shortness of breath, intermittent palpitations, weight gain and orthopnea. Reports she was advised to stop taking lasix within the last month. CXR on admission with edema vs infection. An ECHO was done on 8/22 and LVEF is 65-70% (stable from prior in 05/2016) with moderate LVH. Last LHC in 2017 without significant CAD (30% stenosis in LAD beyond the first diagonal).   Current HF Medications: Furosemide 40 mg IV BID Metoprolol tartrate 25 mg BID Losartan 25 mg daily  Prior to admission HF Medications: Furosemide 20 mg daily (one dose within the last month) Metoprolol tartrate 25 mg BID Losartan 25 mg daily  Pertinent Lab Values: Serum creatinine 1.23, BUN 23, Potassium 4.3, Sodium 136, BNP 385, Magnesium 1.7 A1c 6.2  Vital Signs: Weight: 225 lbs (admission weight: 234 lbs) Blood pressure: 100/50s  Heart rate: 60s   Medication Assistance / Insurance Benefits Check: Does the patient have prescription insurance?  Yes Type of insurance plan: UHC Medicare  Does the patient qualify for medication assistance through manufacturers or grants?   Pending household income info Eligible grants and/or patient assistance programs: pending Medication assistance applications in progress: none  Medication assistance applications approved: none Approved medication assistance renewals will be completed by: pending  Outpatient Pharmacy:  [facility] Is the patient willing to use [place] at discharge? Yes Is the patient willing to transition their outpatient pharmacy to utilize a [facility] outpatient pharmacy?   Pending    Assessment: 1. Acute on chronic diastolic CHF (EF Q000111Q). NYHA class II symptoms. - Continue furosemide 40 mg IV BID - Continue  metoprolol tartrate 25 mg BID - Continue losartan 25 mg daily - Consider adding SGLT2i prior to discharge   Plan: 1) Medication changes recommended at this time: - Add Farxiga 10 mg daily once transitioning to PO diuretics   2) Patient assistance: - Farxiga/Jardiance copay $47 per month - Can help enroll in patient assistance if copays are unaffordable  Kerby Nora, PharmD, BCPS Heart Failure Cytogeneticist Phone [phone]

## 2021-03-05 NOTE — Progress Notes (Signed)
PROGRESS NOTE    [name]  [id number] DOB: 13-Jul-1961 DOA: 03/02/2021 PCP: Ramiro Harvest, PA-C  Chief Complaint  Patient presents with   Shortness of Breath    Brief Narrative:  60 yo F with hx L5 spinal stenosis s/p surgery, HTN, presumed HFpEF, GERD, HTN who presents with worsening shortness of breath after discontinuing lasix.  She's been admitted with HFpEF exacerbation.   Assessment & Plan:   Principal Problem:   Acute exacerbation of CHF (congestive heart failure) (HCC) Active Problems:   Acute respiratory failure with hypoxia (HCC)   Hyponatremia   Diabetes mellitus type 2, controlled (HCC)   Essential hypertension   Normocytic anemia   Depression   Hyperlipidemia   Obesity, Class III, BMI 40-49.9 (morbid obesity) (HCC)   GERD (gastroesophageal reflux disease)  HFpEF Exacerbation Continue BID lasix, consider transition to PO in the AM Continue cozaar, metoprolol 25 mg BID  Appreciate pharm recs, consider farxiga Strict I/O, daily weights CXR 8/21 with asymmetric edema vs atypical infection  Repeat CXR 8/25 AM  Elevated Creatinine Maybe in setting of diuresis, plan for transition to PO dosing in AM  Hypervolemic Hyponatremia Mild, resolved, follow   Hypokalemia Follow   T2DM A1c 6.2 On metformin at home SSI  Depression  Chronic Back Pain  Hx Prior Spine Surgery Tegretol Robaxin Bupropion  Cymbalta Trazodone   OSA on CPAP  GERD PPI, carafate   DVT prophylaxis: lovenox Code Status: full  Family Communication: none at bedside Disposition:   Status is: Inpatient  Remains inpatient appropriate because:Inpatient level of care appropriate due to severity of illness  Dispo: The patient is from: Home              Anticipated d/c is to: Home              Patient currently is not medically stable to d/c.   Difficult to place patient No       Consultants:  none  Procedures: Echo IMPRESSIONS     1. Left  ventricular ejection fraction, by estimation, is 65 to 70%. The  left ventricle has normal function. The left ventricle has no regional  wall motion abnormalities. There is moderate asymmetric left ventricular  hypertrophy of the basal-septal  segment. Left ventricular diastolic parameters are indeterminate.   2. Right ventricular systolic function is normal. The right ventricular  size is normal. Tricuspid regurgitation signal is inadequate for assessing  PA pressure.   3. The mitral valve is normal in structure. Trivial mitral valve  regurgitation.   4. The aortic valve was not well visualized. Aortic valve regurgitation  is mild. No aortic stenosis is present.   5. Aortic dilatation noted. There is mild dilatation of the ascending  aorta, measuring 38 mm.   6. The inferior vena cava is normal in size with greater than 50%  respiratory variability, suggesting right atrial pressure of 3 mmHg.  Antimicrobials:  Anti-infectives (From admission, onward)    None          Subjective: nad  Objective: Vitals:   03/04/21 1332 03/04/21 2019 03/05/21 0421 03/05/21 1300  BP: 134/60 (!) 133/50 (!) 103/58 (!) 98/41  Pulse: 64 66 72 67  Resp: '18 19 16 17  '$ Temp: 98.6 F (37 C) 98.7 F (37.1 C) 98.4 F (36.9 C) 98.7 F (37.1 C)  TempSrc: Oral Oral Oral Oral  SpO2: 91% 93% 94% 96%  Weight:   102.1 kg   Height:  Intake/Output Summary (Last 24 hours) at 03/05/2021 1925 Last data filed at 03/05/2021 0422 Gross per 24 hour  Intake --  Output 800 ml  Net -800 ml   Filed Weights   03/03/21 0534 03/04/21 0709 03/05/21 0421  Weight: 103.9 kg 102.7 kg 102.1 kg    Examination:  General exam: Appears calm and comfortable  Respiratory system: Clear to auscultation. Respiratory effort normal. On 2 L [place] Cardiovascular system: S1 & S2 heard, RRR.  Gastrointestinal system: Abdomen is nondistended, soft and nontender.  Central nervous system: Alert and oriented. No focal  neurological deficits. Extremities: trace edema Skin: No rashes, lesions or ulcers Psychiatry: Judgement and insight appear normal. Mood & affect appropriate.     Data Reviewed: I have personally reviewed following labs and imaging studies  CBC: Recent Labs  Lab 03/02/21 1057 03/05/21 0225  WBC 7.9 8.6  NEUTROABS 5.9 4.2  HGB 11.0* 12.8  HCT 31.6* 37.2  MCV 93.5 94.2  PLT 214 0000000    Basic Metabolic Panel: Recent Labs  Lab 03/02/21 1057 03/03/21 0310 03/04/21 0158 03/05/21 0225  NA 131* 136 133* 136  K 4.0 3.6 3.1* 4.3  CL 95* 91* 90* 89*  CO2 28 32 32 35*  GLUCOSE 140* 128* 126* 133*  BUN '7 8 16 '$ 23*  CREATININE 0.72 0.89 1.00 1.23*  CALCIUM 9.0 9.5 9.3 9.8  MG  --   --   --  1.7    GFR: Estimated Creatinine Clearance: 57.3 mL/min (Dazhane Villagomez) (by C-G formula based on SCr of 1.23 mg/dL (H)).  Liver Function Tests: Recent Labs  Lab 03/04/21 0158 03/05/21 0225  AST 15 17  ALT 15 16  ALKPHOS 78 83  BILITOT 0.9 0.8  PROT 6.6 7.0  ALBUMIN 3.2* 3.3*    CBG: Recent Labs  Lab 03/04/21 1643 03/04/21 2109 03/05/21 0741 03/05/21 1144 03/05/21 1716  GLUCAP 110* 143* 136* 166* 127*     Recent Results (from the past 240 hour(s))  Resp Panel by RT-PCR (Flu Ahtziry Saathoff&B, Covid) Nasopharyngeal Swab     Status: None   Collection Time: 03/02/21 11:04 AM   Specimen: Nasopharyngeal Swab; Nasopharyngeal(NP) swabs in vial transport medium  Result Value Ref Range Status   SARS Coronavirus 2 by RT PCR NEGATIVE NEGATIVE Final    Comment: (NOTE) SARS-CoV-2 target nucleic acids are NOT DETECTED.  The SARS-CoV-2 RNA is generally detectable in upper respiratory specimens during the acute phase of infection. The lowest concentration of SARS-CoV-2 viral copies this assay can detect is 138 copies/mL. Chevelle Coulson negative result does not preclude SARS-Cov-2 infection and should not be used as the sole basis for treatment or other patient management decisions. Gilberta Peeters negative result may occur with   improper specimen collection/handling, submission of specimen other than nasopharyngeal swab, presence of viral mutation(s) within the areas targeted by this assay, and inadequate number of viral copies(<138 copies/mL). Norman Piacentini negative result must be combined with clinical observations, patient history, and epidemiological information. The expected result is Negative.  Fact Sheet for Patients:  EntrepreneurPulse.com.au  Fact Sheet for Healthcare Providers:  IncredibleEmployment.be  This test is no t yet approved or cleared by the [place] FDA and  has been authorized for detection and/or diagnosis of SARS-CoV-2 by FDA under an Emergency Use Authorization (EUA). This EUA will remain  in effect (meaning this test can be used) for the duration of the COVID-19 declaration under Section 564(b)(1) of the Act, 21 U.S.C.section 360bbb-3(b)(1), unless the authorization is terminated  or revoked sooner.  Influenza Rhyli Depaula by PCR NEGATIVE NEGATIVE Final   Influenza B by PCR NEGATIVE NEGATIVE Final    Comment: (NOTE) The Xpert Xpress SARS-CoV-2/FLU/RSV plus assay is intended as an aid in the diagnosis of influenza from Nasopharyngeal swab specimens and should not be used as [place] sole basis for treatment. Nasal washings and aspirates are unacceptable for Xpert Xpress SARS-CoV-2/FLU/RSV testing.  Fact Sheet for Patients: EntrepreneurPulse.com.au  Fact Sheet for Healthcare Providers: IncredibleEmployment.be  This test is not yet approved or cleared by the [place] FDA and has been authorized for detection and/or diagnosis of SARS-CoV-2 by FDA under an Emergency Use Authorization (EUA). This EUA will remain in effect (meaning this test can be used) for the duration of the COVID-19 declaration under Section 564(b)(1) of the Act, 21 U.S.C. section 360bbb-3(b)(1), unless the authorization is terminated  or revoked.  Performed at [facility], [address], [place], [place] [address]          Radiology Studies: No results found.      Scheduled Meds:  buPROPion  300 mg Oral q morning   carbamazepine  600 mg Oral BID   DULoxetine  60 mg Oral BID   enoxaparin (LOVENOX) injection  40 mg Subcutaneous Q24H   furosemide  40 mg Intravenous BID   insulin aspart  0-9 Units Subcutaneous TID WC   losartan  25 mg Oral q morning   metoprolol tartrate  25 mg Oral BID   pantoprazole  40 mg Oral BID   potassium chloride  40 mEq Oral Daily   sodium chloride flush  3 mL Intravenous Q12H   sucralfate  1 g Oral TID   traZODone  200 mg Oral QHS   Continuous Infusions:  sodium chloride       LOS: 3 days    Time spent: over 30 min    Fayrene Helper, MD [facility]   To contact the attending provider between 7A-7P or the covering provider during after hours 7P-7A, please log into the web site [email or web address] and access using universal  password for that web site. If you do not have the password, please call the hospital operator.  03/05/2021, 7:25 PM

## 2021-03-06 ENCOUNTER — Inpatient Hospital Stay (HOSPITAL_COMMUNITY): Payer: Medicare Other

## 2021-03-06 DIAGNOSIS — I5033 Acute on chronic diastolic (congestive) heart failure: Secondary | ICD-10-CM | POA: Diagnosis not present

## 2021-03-06 LAB — MAGNESIUM: Magnesium: 1.9 mg/dL (ref 1.7–2.4)

## 2021-03-06 LAB — COMPREHENSIVE METABOLIC PANEL
ALT: 23 U/L (ref 0–44)
AST: 21 U/L (ref 15–41)
Albumin: 3.2 g/dL — ABNORMAL LOW (ref 3.5–5.0)
Alkaline Phosphatase: 82 U/L (ref 38–126)
Anion gap: 10 (ref 5–15)
BUN: 25 mg/dL — ABNORMAL HIGH (ref 6–20)
CO2: 32 mmol/L (ref 22–32)
Calcium: 9.7 mg/dL (ref 8.9–10.3)
Chloride: 92 mmol/L — ABNORMAL LOW (ref 98–111)
Creatinine, Ser: 1.07 mg/dL — ABNORMAL HIGH (ref 0.44–1.00)
GFR, Estimated: 60 mL/min — ABNORMAL LOW (ref >60)
Glucose, Bld: 130 mg/dL — ABNORMAL HIGH (ref 70–99)
Potassium: 3.7 mmol/L (ref 3.5–5.1)
Sodium: 134 mmol/L — ABNORMAL LOW (ref 135–145)
Total Bilirubin: 0.5 mg/dL (ref 0.3–1.2)
Total Protein: 6.8 g/dL (ref 6.5–8.1)

## 2021-03-06 LAB — GLUCOSE, CAPILLARY
Glucose-Capillary: 146 mg/dL — ABNORMAL HIGH (ref 70–99)
Glucose-Capillary: 139 mg/dL — ABNORMAL HIGH (ref 70–99)

## 2021-03-06 LAB — CBC WITH DIFFERENTIAL/PLATELET
Abs Immature Granulocytes: 0.02 10*3/uL (ref 0.00–0.07)
Basophils Absolute: 0.1 10*3/uL (ref 0.0–0.1)
Basophils Relative: 1 %
Eosinophils Absolute: 0.2 10*3/uL (ref 0.0–0.5)
Eosinophils Relative: 3 %
HCT: 37.3 % (ref 36.0–46.0)
Hemoglobin: 12.7 g/dL (ref 12.0–15.0)
Immature Granulocytes: 0 %
Lymphocytes Relative: 36 %
Lymphs Abs: 2.8 10*3/uL (ref 0.7–4.0)
MCH: 32.1 pg (ref 26.0–34.0)
MCHC: 34 g/dL (ref 30.0–36.0)
MCV: 94.2 fL (ref 80.0–100.0)
Monocytes Absolute: 0.9 10*3/uL (ref 0.1–1.0)
Monocytes Relative: 11 %
Neutro Abs: 3.7 10*3/uL (ref 1.7–7.7)
Neutrophils Relative %: 49 %
Platelets: 306 10*3/uL (ref 150–400)
RBC: 3.96 MIL/uL (ref 3.87–5.11)
RDW: 11.7 % (ref 11.5–15.5)
WBC: 7.7 10*3/uL (ref 4.0–10.5)
nRBC: 0 % (ref 0.0–0.2)

## 2021-03-06 LAB — PHOSPHORUS: Phosphorus: 3.7 mg/dL (ref 2.5–4.6)

## 2021-03-06 MED ORDER — POTASSIUM CHLORIDE CRYS ER 20 MEQ PO TBCR: 20.0000 meq | EXTENDED_RELEASE_TABLET | Freq: Every day | ORAL | 0 refills | Status: AC

## 2021-03-06 MED ORDER — FUROSEMIDE 40 MG PO TABS: 40.0000 mg | ORAL_TABLET | Freq: Every day | ORAL | 0 refills | Status: AC

## 2021-03-06 NOTE — Progress Notes (Signed)
Heart Failure Stewardship Pharmacist Progress Note   PCP: Ramiro Harvest, PA-C PCP-Cardiologist: None    HPI:  60 yo F with HTN, HFpEF, HLD, depression, and T2DM. She presented to the ED on 8/21 with shortness of breath, intermittent palpitations, weight gain and orthopnea. Reports she was advised to stop taking lasix within the last month. CXR on admission with edema vs infection. An ECHO was done on 8/22 and LVEF is 65-70% (stable from prior in 05/2016) with moderate LVH. Last LHC in 2017 without significant CAD (30% stenosis in LAD beyond the first diagonal).   Current HF Medications: Furosemide 40 mg PO daily Metoprolol tartrate 25 mg BID Losartan 25 mg daily  Prior to admission HF Medications: Furosemide 20 mg daily (one dose within the last month) Metoprolol tartrate 25 mg BID Losartan 25 mg daily  Pertinent Lab Values: Serum creatinine 1.07, BUN 25, Potassium 3.7, Sodium 134, BNP 385, Magnesium 1.9 A1c 6.2  Vital Signs: Weight: 223 lbs (admission weight: 234 lbs) Blood pressure: 110/50s  Heart rate: 60-80s   Medication Assistance / Insurance Benefits Check: Does the patient have prescription insurance?  Yes Type of insurance plan: UHC Medicare  Does the patient qualify for medication assistance through manufacturers or grants?   Pending household income info Eligible grants and/or patient assistance programs: pending Medication assistance applications in progress: none  Medication assistance applications approved: none Approved medication assistance renewals will be completed by: pending  Outpatient Pharmacy:  Prior to admission outpatient pharmacy: [facility] Is the patient willing to use [facility] at discharge? Yes Is the patient willing to transition their outpatient pharmacy to utilize a [facility] outpatient pharmacy?   Pending    Assessment: 1. Acute on chronic diastolic CHF (EF [id number]). NYHA class II symptoms. - Agree with transitioning to furosemide  40 mg PO daily - Continue metoprolol tartrate 25 mg BID - Continue losartan 25 mg daily - Consider adding SGLT2i prior to discharge   Plan: 1) Medication changes recommended at this time: - Add Farxiga 10 mg daily   2) Patient assistance: - Farxiga/Jardiance copay $47 per month - Can help enroll in patient assistance if copays are unaffordable  Kerby Nora, PharmD, BCPS Heart Failure Cytogeneticist Phone [phone]

## 2021-03-06 NOTE — Care Management Important Message (Signed)
Important Message  Patient Details  Name: [name] MRN: [id number] Date of Birth: Nov 25, 1960   Medicare Important Message Given:  Yes     Shelda Altes 03/06/2021, 10:29 AM

## 2021-03-06 NOTE — Discharge Summary (Signed)
Physician Discharge Summary  [name] [id number] DOB: 11-28-1960 DOA: 03/02/2021  PCP: Ramiro Harvest, PA-C  Admit date: 03/02/2021 Discharge date: 03/06/2021  Time spent: 40 minutes  Recommendations for Outpatient Follow-up:  Follow CBC/CMP Follow volume status/lytes/creatinine on lasix/klor con Follow with cardiology outpatient Follow aortic dilatation  Discharge Diagnoses:  Principal Problem:   Acute exacerbation of CHF (congestive heart failure) ([place]) Active Problems:   Acute respiratory failure with hypoxia ([facility])   Hyponatremia   Diabetes mellitus type 2, controlled ([place])   Essential hypertension   Normocytic anemia   Depression   Hyperlipidemia   Obesity, Class III, BMI 40-49.9 (morbid obesity) ([place])   GERD (gastroesophageal reflux disease)   Discharge Condition: stable  Diet recommendation: heart healthy  Filed Weights   03/04/21 0709 03/05/21 0421 03/06/21 0452  Weight: 102.7 kg 102.1 kg 101.2 kg    History of present illness:  60 yo F with hx L5 spinal stenosis s/p surgery, HTN, presumed HFpEF, GERD, HTN who presents with worsening shortness of breath after discontinuing lasix.  She's been admitted with HFpEF exacerbation.   She's improved after diuresis.  Transitioned to PO lasix.  Cardiology follow up recommended.  Hospital Course:  HFpEF Exacerbation Continue lasix 40 mg daily with 20 meq potassium Continue cozaar, metoprolol 25 mg BID  Appreciate pharm recs, consider farxiga outpatient Cardiology follow up outpatient, requested cardiology follow up (msg sent to [facility]) Strict I/O, daily weights CXR 8/21 with asymmetric edema vs atypical infection  Repeat CXR 8/25 AM without acute abnormalities   Elevated Creatinine Improved, follow outpatient   Hypervolemic Hyponatremia Mild, follow outpatient with continued diuresis   Hypokalemia Supplement k with lasix   T2DM A1c 6.2 On metformin at home - resume SSI   Depression   Chronic Back Pain  Hx Prior Spine Surgery Tegretol Robaxin Bupropion  Cymbalta Trazodone    OSA on CPAP   GERD PPI, carafate   Hx CVA Continue plavix  Procedures: Echo IMPRESSIONS     1. Left ventricular ejection fraction, by estimation, is 65 to 70%. The  left ventricle has normal function. The left ventricle has no regional  wall motion abnormalities. There is moderate asymmetric left ventricular  hypertrophy of the basal-septal  segment. Left ventricular diastolic parameters are indeterminate.   2. Right ventricular systolic function is normal. The right ventricular  size is normal. Tricuspid regurgitation signal is inadequate for assessing  PA pressure.   3. The mitral valve is normal in structure. Trivial mitral valve  regurgitation.   4. The aortic valve was not well visualized. Aortic valve regurgitation  is mild. No aortic stenosis is present.   5. Aortic dilatation noted. There is mild dilatation of the ascending  aorta, measuring 38 mm.   6. The inferior vena cava is normal in size with greater than 50%  respiratory variability, suggesting right atrial pressure of 3 mmHg.   Consultations: none  Discharge Exam: Vitals:   03/06/21 0452 03/06/21 0840  BP: (!) 108/45 107/73  Pulse: 81 71  Resp: 16   Temp: 98.7 F (37.1 C) 97.7 F (36.5 C)  SpO2: 96% 93%   Feels well, eager to d/c Husband at bedside  General: No acute distress. Cardiovascular: Heart sounds show [name] regular rate, and rhythm.  Lungs: Clear to auscultation bilaterally Abdomen: Soft, nontender, nondistended Neurological: Alert and oriented 3. Moves all extremities 4. Cranial nerves II through XII grossly intact. Skin: Warm and dry. No rashes or lesions. Extremities: No clubbing or cyanosis. No  edema.   Discharge Instructions   Discharge Instructions     (HEART FAILURE PATIENTS) Call MD:  Anytime you have any of the following symptoms: 1) 3 pound weight gain in 24 hours or 5 pounds  in 1 week 2) shortness of breath, with or without [name] dry hacking cough 3) swelling in the hands, feet or stomach 4) if you have to sleep on extra pillows at night in order to breathe.   Complete by: As directed    Call MD for:  difficulty breathing, headache or visual disturbances   Complete by: As directed    Call MD for:  extreme fatigue   Complete by: As directed    Call MD for:  hives   Complete by: As directed    Call MD for:  persistant dizziness or light-headedness   Complete by: As directed    Call MD for:  persistant nausea and vomiting   Complete by: As directed    Call MD for:  redness, tenderness, or signs of infection (pain, swelling, redness, odor or green/yellow discharge around incision site)   Complete by: As directed    Call MD for:  severe uncontrolled pain   Complete by: As directed    Call MD for:  temperature >100.4   Complete by: As directed    Diet - low sodium heart healthy   Complete by: As directed    Discharge instructions   Complete by: As directed    You were seen for [name] heart failure exacerbation.   You've improved after diuresis.  We'll resume your lasix and increase it to 40 mg daily.  Take this with 20 meq of potassium daily.  You should have repeat labs in about [name] week to make sure your kidney function and electrolytes look ok on this regimen.    Follow your weight every day.  Weigh yourself daily.  If your weight increases by 2-3 lbs in [name] day or by 5 lbs in [name] week, call your PCP to see if you need to increase your lasix dose.  Continue your losartan and metoprolol.  Consider cardiology follow up outpatient with your PCP.  Please review your medicines with your PCP outpatient.  Return for new, recurrent, or worsening symptoms.  Please ask your PCP to request records from this hospitalization so they know what was done and what the next steps will be.   Increase activity slowly   Complete by: As directed       Allergies as of 03/06/2021   No  Known Allergies      Medication List     TAKE these medications    acetaminophen 500 MG tablet Commonly known as: TYLENOL Take 1,000 mg by mouth every 6 (six) hours as needed for headache (pain).   atorvastatin 10 MG tablet Commonly known as: LIPITOR Take 10 mg by mouth daily.   buPROPion 300 MG 24 hr tablet Commonly known as: WELLBUTRIN XL Take 300 mg by mouth every morning.   carbamazepine 200 MG tablet Commonly known as: TEGRETOL Take 600 mg by mouth 2 (two) times daily.   clopidogrel 75 MG tablet Commonly known as: PLAVIX Take 75 mg by mouth daily.   dicyclomine 20 MG tablet Commonly known as: BENTYL Take 20 mg by mouth 3 (three) times daily as needed for spasms (stomach pain).   DULoxetine 60 MG capsule Commonly known as: CYMBALTA Take 60 mg by mouth 2 (two) times daily.   FIBER PO Take 2 tablets by mouth  daily.   fluticasone 50 MCG/ACT nasal spray Commonly known as: FLONASE Place 1 spray into both nostrils daily as needed for allergies or rhinitis.   furosemide 40 MG tablet Commonly known as: LASIX Take 1 tablet (40 mg total) by mouth daily. Start taking on: March 07, 2021 What changed:  medication strength how much to take   losartan 25 MG tablet Commonly known as: COZAAR Take 25 mg by mouth every morning.   metFORMIN 500 MG 24 hr tablet Commonly known as: GLUCOPHAGE-XR Take 500 mg by mouth 2 (two) times daily.   methocarbamol 750 MG tablet Commonly known as: ROBAXIN Take 750 mg by mouth 3 (three) times daily as needed for muscle spasms.   metoprolol tartrate 25 MG tablet Commonly known as: LOPRESSOR Take 25 mg by mouth 2 (two) times daily.   ondansetron 8 MG tablet Commonly known as: ZOFRAN Take 8 mg by mouth daily as needed for nausea or vomiting.   pantoprazole 40 MG tablet Commonly known as: PROTONIX Take 40 mg by mouth 2 (two) times daily.   potassium chloride SA 20 MEQ tablet Commonly known as: KLOR-CON Take 1 tablet (20 mEq  total) by mouth daily for 1 day. Start taking on: March 07, 2021   sucralfate 1 g tablet Commonly known as: CARAFATE Take 1 g by mouth 3 (three) times daily.   traZODone 100 MG tablet Commonly known as: DESYREL Take 200 mg by mouth at bedtime.   Vitamin D-3 125 MCG (5000 UT) Tabs Take 5,000 Units by mouth daily.       No Known Allergies    The results of significant diagnostics from this hospitalization (including imaging, microbiology, ancillary and laboratory) are listed below for reference.    Significant Diagnostic Studies: DG Chest 2 View  Result Date: 03/02/2021 CLINICAL DATA:  Shortness of breath in Kalvin Buss 60 year old female. EXAM: CHEST - 2 VIEW COMPARISON:  Comparison made with February 02, 2021. FINDINGS: Trachea is midline. Cardiomediastinal contours are stable accounting for portable AP projection. Bilateral interstitial and alveolar opacities with central predominance and asymmetry are noted. Small effusions are present. No visible pneumothorax. On limited assessment there is no acute skeletal process. IMPRESSION: Bilateral interstitial and alveolar opacities with central predominance and asymmetry, could reflect asymmetric edema or atypical infection. Pattern could also be seen in the setting of pulmonary hemorrhage. Small bilateral pleural effusions. Electronically Signed   By: Zetta Bills M.D.   On: 03/02/2021 11:27   DG CHEST PORT 1 VIEW  Result Date: 03/06/2021 CLINICAL DATA:  Hypoxia EXAM: PORTABLE CHEST 1 VIEW COMPARISON:  03/02/2021 FINDINGS: Anterior cervical fusion. Normal mediastinum and cardiac silhouette. Normal pulmonary vasculature. No evidence of effusion, infiltrate, or pneumothorax. No acute bony abnormality. IMPRESSION: No acute cardiopulmonary process. Electronically Signed   By: Suzy Bouchard M.D.   On: 03/06/2021 08:35   DG UGI W DOUBLE CM (HD BA)  Result Date: 02/12/2021 CLINICAL DATA:  Epigastric abdominal pain EXAM: UPPER GI SERIES WITH KUB  TECHNIQUE: After obtaining [name] scout radiograph [name] routine upper GI series was performed using thin and high density barium. FLUOROSCOPY TIME:  Fluoroscopy Time:  3 minutes 0 seconds Radiation Exposure Index (if provided by the fluoroscopic device): 56 mGy Number of Acquired Spot Images: 6 COMPARISON:  CT abdomen pelvis 06/26/2017 FINDINGS: Normal caliber and motility of the thoracic esophagus. No hiatal hernia. Moderate gastroesophageal reflux. No significant abnormality of the stomach or duodenum. No aspiration or penetration noted on cervical esophagram. There is mild vallecular residue. IMPRESSION:  Moderate gastroesophageal reflux. Electronically Signed   By: Miachel Roux M.D.   On: 02/12/2021 13:55   ECHOCARDIOGRAM COMPLETE  Result Date: 03/03/2021    ECHOCARDIOGRAM REPORT   Patient Name:   [name] [place] Date of Exam: 03/03/2021 Medical Rec #:  [id number]           Height:       64.0 in Accession #:    [id number]          Weight:       229.1 lb Date of Birth:  [birth year]           BSA:          2.073 m Patient Age:    60 years            BP:           123/55 mmHg Patient Gender: F                   HR:           70 bpm. Exam Location:  Inpatient Procedure: 2D Echo, Cardiac Doppler, Color Doppler and 3D Echo Indications:    I50.40* Unspecified combined systolic (congestive) and diastolic                 (congestive) heart failure  History:        Patient has prior history of Echocardiogram examinations, most                 recent 06/03/2016. CHF, Signs/Symptoms:Murmur; Risk                 Factors:Hypertension, Dyslipidemia and Diabetes. Hypoxia.  Sonographer:    [name] RDCS Referring Phys: [id number] [facility] Koah Chisenhall SMITH  Sonographer Comments: Image acquisition challenging due to patient body habitus. IMPRESSIONS  1. Left ventricular ejection fraction, by estimation, is 65 to 70%. The left ventricle has normal function. The left ventricle has no regional wall motion abnormalities. There is moderate asymmetric  left ventricular hypertrophy of the basal-septal segment. Left ventricular diastolic parameters are indeterminate.  2. Right ventricular systolic function is normal. The right ventricular size is normal. Tricuspid regurgitation signal is inadequate for assessing PA pressure.  3. The mitral valve is normal in structure. Trivial mitral valve regurgitation.  4. The aortic valve was not well visualized. Aortic valve regurgitation is mild. No aortic stenosis is present.  5. Aortic dilatation noted. There is mild dilatation of the ascending aorta, measuring 38 mm.  6. The inferior vena cava is normal in size with greater than 50% respiratory variability, suggesting right atrial pressure of 3 mmHg. FINDINGS  Left Ventricle: Left ventricular ejection fraction, by estimation, is 65 to 70%. The left ventricle has normal function. The left ventricle has no regional wall motion abnormalities. The left ventricular internal cavity size was normal in size. There is  moderate asymmetric left ventricular hypertrophy of the basal-septal segment. Left ventricular diastolic parameters are indeterminate. Right Ventricle: The right ventricular size is normal. No increase in right ventricular wall thickness. Right ventricular systolic function is normal. Tricuspid regurgitation signal is inadequate for assessing PA pressure. Left Atrium: Left atrial size was normal in size. Right Atrium: Right atrial size was normal in size. Pericardium: Trivial pericardial effusion is present. Mitral Valve: The mitral valve is normal in structure. Trivial mitral valve regurgitation. Tricuspid Valve: The tricuspid valve is normal in structure. Tricuspid valve regurgitation is trivial. Aortic Valve: The aortic valve was not well visualized. Aortic valve  regurgitation is mild. No aortic stenosis is present. Pulmonic Valve: The pulmonic valve was not well visualized. Pulmonic valve regurgitation is trivial. Aorta: The aortic root is normal in size and  structure and aortic dilatation noted. There is mild dilatation of the ascending aorta, measuring 38 mm. Venous: The inferior vena cava is normal in size with greater than 50% respiratory variability, suggesting right atrial pressure of 3 mmHg. IAS/Shunts: The interatrial septum was not well visualized.  LEFT VENTRICLE PLAX 2D LVIDd:         4.20 cm     Diastology LVIDs:         2.50 cm     LV e' medial:    5.87 cm/s LV PW:         1.10 cm     LV E/e' medial:  18.7 LV IVS:        1.40 cm     LV e' lateral:   6.09 cm/s LVOT diam:     1.70 cm     LV E/e' lateral: 18.1 LV SV:         69 LV SV Index:   33 LVOT Area:     2.27 cm                             3D Volume EF: LV Volumes (MOD)           3D EF:        62 % LV vol d, MOD A2C: 53.9 ml LV EDV:       74 ml LV vol d, MOD A4C: 51.4 ml LV ESV:       28 ml LV vol s, MOD A2C: 15.3 ml LV SV:        46 ml LV vol s, MOD A4C: 12.8 ml LV SV MOD A2C:     38.6 ml LV SV MOD A4C:     51.4 ml LV SV MOD BP:      39.2 ml RIGHT VENTRICLE             IVC RV S prime:     18.40 cm/s  IVC diam: 1.60 cm TAPSE (M-mode): 2.2 cm LEFT ATRIUM             Index       RIGHT ATRIUM           Index LA diam:        3.60 cm 1.74 cm/m  RA Area:     10.40 cm LA Vol (A2C):   34.6 ml 16.69 ml/m RA Volume:   20.50 ml  9.89 ml/m LA Vol (A4C):   50.2 ml 24.22 ml/m LA Biplane Vol: 45.1 ml 21.76 ml/m  AORTIC VALVE             PULMONIC VALVE LVOT Vmax:   141.00 cm/s PR End Diast Vel: 1.93 msec LVOT Vmean:  96.800 cm/s LVOT VTI:    0.302 m  AORTA Ao Root diam: 2.80 cm Ao Asc diam:  3.80 cm MITRAL VALVE MV Area (PHT): 3.48 cm     SHUNTS MV Decel Time: 218 msec     Systemic VTI:  0.30 m MV E velocity: 110.00 cm/s  Systemic Diam: 1.70 cm MV Ivorie Uplinger velocity: 120.00 cm/s MV E/Marquiz Sotelo ratio:  0.92 Oswaldo Milian MD Electronically signed by Oswaldo Milian MD Signature Date/Time: 03/03/2021/3:45:19 PM    Final     Microbiology: Recent Results (from the  past 240 hour(s))  Resp Panel by RT-PCR (Flu Anarely Nicholls&B,  Covid) Nasopharyngeal Swab     Status: None   Collection Time: 03/02/21 11:04 AM   Specimen: Nasopharyngeal Swab; Nasopharyngeal(NP) swabs in vial transport medium  Result Value Ref Range Status   SARS Coronavirus 2 by RT PCR NEGATIVE NEGATIVE Final    Comment: (NOTE) SARS-CoV-2 target nucleic acids are NOT DETECTED.  The SARS-CoV-2 RNA is generally detectable in upper respiratory specimens during the acute phase of infection. The lowest concentration of SARS-CoV-2 viral copies this assay can detect is 138 copies/mL. Kerry Chisolm negative result does not preclude SARS-Cov-2 infection and should not be used as the sole basis for treatment or other patient management decisions. Estephania Licciardi negative result may occur with  improper specimen collection/handling, submission of specimen other than nasopharyngeal swab, presence of viral mutation(s) within the areas targeted by this assay, and inadequate number of viral copies(<138 copies/mL). Carylon Tamburro negative result must be combined with clinical observations, patient history, and epidemiological information. The expected result is Negative.  Fact Sheet for Patients:  EntrepreneurPulse.com.au  Fact Sheet for Healthcare Providers:  IncredibleEmployment.be  This test is no t yet approved or cleared by the [place] FDA and  has been authorized for detection and/or diagnosis of SARS-CoV-2 by FDA under an Emergency Use Authorization (EUA). This EUA will remain  in effect (meaning this test can be used) for the duration of the COVID-19 declaration under Section 564(b)(1) of the Act, 21 U.S.C.section 360bbb-3(b)(1), unless the authorization is terminated  or revoked sooner.       Influenza Jaidalyn Schillo by PCR NEGATIVE NEGATIVE Final   Influenza B by PCR NEGATIVE NEGATIVE Final    Comment: (NOTE) The Xpert Xpress SARS-CoV-2/FLU/RSV plus assay is intended as an aid in the diagnosis of influenza from Nasopharyngeal swab specimens and should  not be used as Deloris Moger sole basis for treatment. Nasal washings and aspirates are unacceptable for Xpert Xpress SARS-CoV-2/FLU/RSV testing.  Fact Sheet for Patients: EntrepreneurPulse.com.au  Fact Sheet for Healthcare Providers: IncredibleEmployment.be  This test is not yet approved or cleared by the [place] FDA and has been authorized for detection and/or diagnosis of SARS-CoV-2 by FDA under an Emergency Use Authorization (EUA). This EUA will remain in effect (meaning this test can be used) for the duration of the COVID-19 declaration under Section 564(b)(1) of the Act, 21 U.S.C. section 360bbb-3(b)(1), unless the authorization is terminated or revoked.  Performed at [facility], [address], [place], [place] [address]      Labs: Basic Metabolic Panel: Recent Labs  Lab 03/02/21 1057 03/03/21 0310 03/04/21 0158 03/05/21 0225 03/06/21 0242  NA 131* 136 133* 136 134*  K 4.0 3.6 3.1* 4.3 3.7  CL 95* 91* 90* 89* 92*  CO2 28 32 32 35* 32  GLUCOSE 140* 128* 126* 133* 130*  BUN '7 8 16 '$ 23* 25*  CREATININE 0.72 0.89 1.00 1.23* 1.07*  CALCIUM 9.0 9.5 9.3 9.8 9.7  MG  --   --   --  1.7 1.9  PHOS  --   --   --   --  3.7   Liver Function Tests: Recent Labs  Lab 03/04/21 0158 03/05/21 0225 03/06/21 0242  AST '15 17 21  '$ ALT '15 16 23  '$ ALKPHOS 78 83 82  BILITOT 0.9 0.8 0.5  PROT 6.6 7.0 6.8  ALBUMIN 3.2* 3.3* 3.2*   No results for input(s): LIPASE, AMYLASE in the last 168 hours. No results for input(s): AMMONIA in the last  168 hours. CBC: Recent Labs  Lab 03/02/21 1057 03/05/21 0225 03/06/21 0242  WBC 7.9 8.6 7.7  NEUTROABS 5.9 4.2 3.7  HGB 11.0* 12.8 12.7  HCT 31.6* 37.2 37.3  MCV 93.5 94.2 94.2  PLT 214 278 306   Cardiac Enzymes: No results for input(s): CKTOTAL, CKMB, CKMBINDEX, TROPONINI in the last 168 hours. BNP: BNP (last 3 results) Recent Labs    03/02/21 1121  BNP 385.0*    ProBNP (last  3 results) No results for input(s): PROBNP in the last 8760 hours.  CBG: Recent Labs  Lab 03/05/21 1144 03/05/21 1716 03/05/21 2128 03/06/21 0743 03/06/21 1125  GLUCAP 166* 127* 166* 146* 139*       Signed:  Fayrene Helper MD.  [facility] 03/06/2021, 6:52 PM

## 2021-03-26 DIAGNOSIS — Z7984 Long term (current) use of oral hypoglycemic drugs: Secondary | ICD-10-CM | POA: Diagnosis not present

## 2021-03-26 DIAGNOSIS — I10 Essential (primary) hypertension: Secondary | ICD-10-CM | POA: Diagnosis not present

## 2021-03-26 DIAGNOSIS — I5032 Chronic diastolic (congestive) heart failure: Secondary | ICD-10-CM | POA: Diagnosis not present

## 2021-03-26 DIAGNOSIS — R42 Dizziness and giddiness: Secondary | ICD-10-CM | POA: Diagnosis not present

## 2021-03-26 DIAGNOSIS — E1169 Type 2 diabetes mellitus with other specified complication: Secondary | ICD-10-CM | POA: Diagnosis not present

## 2021-04-02 DIAGNOSIS — I5030 Unspecified diastolic (congestive) heart failure: Secondary | ICD-10-CM | POA: Diagnosis not present

## 2021-04-02 DIAGNOSIS — I5032 Chronic diastolic (congestive) heart failure: Secondary | ICD-10-CM | POA: Diagnosis not present

## 2021-04-02 DIAGNOSIS — K219 Gastro-esophageal reflux disease without esophagitis: Secondary | ICD-10-CM | POA: Diagnosis not present

## 2021-04-02 DIAGNOSIS — F32A Depression, unspecified: Secondary | ICD-10-CM | POA: Diagnosis not present

## 2021-04-02 DIAGNOSIS — E119 Type 2 diabetes mellitus without complications: Secondary | ICD-10-CM | POA: Diagnosis not present

## 2021-04-02 DIAGNOSIS — Z9989 Dependence on other enabling machines and devices: Secondary | ICD-10-CM | POA: Diagnosis not present

## 2021-04-02 DIAGNOSIS — G4733 Obstructive sleep apnea (adult) (pediatric): Secondary | ICD-10-CM | POA: Diagnosis not present

## 2021-04-02 DIAGNOSIS — Z7984 Long term (current) use of oral hypoglycemic drugs: Secondary | ICD-10-CM | POA: Diagnosis not present

## 2021-04-02 DIAGNOSIS — I110 Hypertensive heart disease with heart failure: Secondary | ICD-10-CM | POA: Diagnosis not present

## 2021-04-03 DIAGNOSIS — N289 Disorder of kidney and ureter, unspecified: Secondary | ICD-10-CM | POA: Diagnosis not present

## 2021-08-18 DIAGNOSIS — G4733 Obstructive sleep apnea (adult) (pediatric): Secondary | ICD-10-CM | POA: Diagnosis not present

## 2021-08-18 DIAGNOSIS — G4700 Insomnia, unspecified: Secondary | ICD-10-CM | POA: Diagnosis not present

## 2021-08-18 DIAGNOSIS — G4769 Other sleep related movement disorders: Secondary | ICD-10-CM | POA: Diagnosis not present

## 2021-08-18 DIAGNOSIS — Z9989 Dependence on other enabling machines and devices: Secondary | ICD-10-CM | POA: Diagnosis not present

## 2021-08-28 ENCOUNTER — Other Ambulatory Visit: Payer: Self-pay | Admitting: Family Medicine

## 2021-08-28 DIAGNOSIS — Z1231 Encounter for screening mammogram for malignant neoplasm of breast: Secondary | ICD-10-CM

## 2021-10-10 ENCOUNTER — Ambulatory Visit: Payer: Medicare Other

## 2021-10-13 DIAGNOSIS — E119 Type 2 diabetes mellitus without complications: Secondary | ICD-10-CM | POA: Diagnosis not present

## 2021-10-13 DIAGNOSIS — Z7984 Long term (current) use of oral hypoglycemic drugs: Secondary | ICD-10-CM | POA: Diagnosis not present

## 2021-10-13 DIAGNOSIS — Z23 Encounter for immunization: Secondary | ICD-10-CM | POA: Diagnosis not present

## 2021-10-13 DIAGNOSIS — Z87891 Personal history of nicotine dependence: Secondary | ICD-10-CM | POA: Diagnosis not present

## 2021-10-13 DIAGNOSIS — I10 Essential (primary) hypertension: Secondary | ICD-10-CM | POA: Diagnosis not present

## 2021-10-16 DIAGNOSIS — M1711 Unilateral primary osteoarthritis, right knee: Secondary | ICD-10-CM | POA: Diagnosis not present

## 2021-10-16 DIAGNOSIS — M1712 Unilateral primary osteoarthritis, left knee: Secondary | ICD-10-CM | POA: Diagnosis not present

## 2021-10-31 ENCOUNTER — Ambulatory Visit
Admission: RE | Admit: 2021-10-31 | Discharge: 2021-10-31 | Disposition: A | Discharge status: Home / Self Care | Payer: Medicare Other | Source: Ambulatory Visit | Attending: Family Medicine | Admitting: Family Medicine

## 2021-10-31 DIAGNOSIS — Z1231 Encounter for screening mammogram for malignant neoplasm of breast: Secondary | ICD-10-CM

## 2021-11-14 DIAGNOSIS — N289 Disorder of kidney and ureter, unspecified: Secondary | ICD-10-CM | POA: Diagnosis not present

## 2021-11-18 DIAGNOSIS — Z20822 Contact with and (suspected) exposure to covid-19: Secondary | ICD-10-CM | POA: Diagnosis not present

## 2021-11-18 DIAGNOSIS — Z03818 Encounter for observation for suspected exposure to other biological agents ruled out: Secondary | ICD-10-CM | POA: Diagnosis not present

## 2021-11-18 DIAGNOSIS — R0982 Postnasal drip: Secondary | ICD-10-CM | POA: Diagnosis not present

## 2021-11-18 DIAGNOSIS — J0100 Acute maxillary sinusitis, unspecified: Secondary | ICD-10-CM | POA: Diagnosis not present

## 2021-11-19 DIAGNOSIS — E1121 Type 2 diabetes mellitus with diabetic nephropathy: Secondary | ICD-10-CM | POA: Diagnosis not present

## 2021-11-19 DIAGNOSIS — J0100 Acute maxillary sinusitis, unspecified: Secondary | ICD-10-CM | POA: Diagnosis not present

## 2021-11-24 DIAGNOSIS — M5116 Intervertebral disc disorders with radiculopathy, lumbar region: Secondary | ICD-10-CM | POA: Diagnosis not present

## 2021-11-24 DIAGNOSIS — G959 Disease of spinal cord, unspecified: Secondary | ICD-10-CM | POA: Diagnosis not present

## 2021-12-10 DIAGNOSIS — E119 Type 2 diabetes mellitus without complications: Secondary | ICD-10-CM | POA: Diagnosis not present

## 2021-12-10 DIAGNOSIS — Z7984 Long term (current) use of oral hypoglycemic drugs: Secondary | ICD-10-CM | POA: Diagnosis not present

## 2021-12-22 DIAGNOSIS — Z8673 Personal history of transient ischemic attack (TIA), and cerebral infarction without residual deficits: Secondary | ICD-10-CM | POA: Diagnosis not present

## 2021-12-22 DIAGNOSIS — G959 Disease of spinal cord, unspecified: Secondary | ICD-10-CM | POA: Diagnosis not present

## 2021-12-22 DIAGNOSIS — R55 Syncope and collapse: Secondary | ICD-10-CM | POA: Diagnosis not present

## 2022-01-28 DIAGNOSIS — E119 Type 2 diabetes mellitus without complications: Secondary | ICD-10-CM | POA: Diagnosis not present

## 2022-01-28 DIAGNOSIS — Z9989 Dependence on other enabling machines and devices: Secondary | ICD-10-CM | POA: Diagnosis not present

## 2022-01-28 DIAGNOSIS — E1122 Type 2 diabetes mellitus with diabetic chronic kidney disease: Secondary | ICD-10-CM | POA: Diagnosis not present

## 2022-01-28 DIAGNOSIS — N1832 Chronic kidney disease, stage 3b: Secondary | ICD-10-CM | POA: Diagnosis not present

## 2022-01-28 DIAGNOSIS — N183 Chronic kidney disease, stage 3 unspecified: Secondary | ICD-10-CM | POA: Diagnosis not present

## 2022-01-28 DIAGNOSIS — Z7984 Long term (current) use of oral hypoglycemic drugs: Secondary | ICD-10-CM | POA: Diagnosis not present

## 2022-01-28 DIAGNOSIS — I5030 Unspecified diastolic (congestive) heart failure: Secondary | ICD-10-CM | POA: Diagnosis not present

## 2022-01-28 DIAGNOSIS — K219 Gastro-esophageal reflux disease without esophagitis: Secondary | ICD-10-CM | POA: Diagnosis not present

## 2022-01-28 DIAGNOSIS — I110 Hypertensive heart disease with heart failure: Secondary | ICD-10-CM | POA: Diagnosis not present

## 2022-01-28 DIAGNOSIS — G4733 Obstructive sleep apnea (adult) (pediatric): Secondary | ICD-10-CM | POA: Diagnosis not present

## 2022-01-28 DIAGNOSIS — I129 Hypertensive chronic kidney disease with stage 1 through stage 4 chronic kidney disease, or unspecified chronic kidney disease: Secondary | ICD-10-CM | POA: Diagnosis not present

## 2022-01-28 DIAGNOSIS — F32A Depression, unspecified: Secondary | ICD-10-CM | POA: Diagnosis not present

## 2022-02-02 DIAGNOSIS — N200 Calculus of kidney: Secondary | ICD-10-CM | POA: Diagnosis not present

## 2022-02-02 DIAGNOSIS — N183 Chronic kidney disease, stage 3 unspecified: Secondary | ICD-10-CM | POA: Diagnosis not present

## 2022-02-10 DIAGNOSIS — Z9989 Dependence on other enabling machines and devices: Secondary | ICD-10-CM | POA: Diagnosis not present

## 2022-02-10 DIAGNOSIS — G4700 Insomnia, unspecified: Secondary | ICD-10-CM | POA: Diagnosis not present

## 2022-02-10 DIAGNOSIS — G259 Extrapyramidal and movement disorder, unspecified: Secondary | ICD-10-CM | POA: Diagnosis not present

## 2022-02-10 DIAGNOSIS — Z79899 Other long term (current) drug therapy: Secondary | ICD-10-CM | POA: Diagnosis not present

## 2022-02-10 DIAGNOSIS — G4733 Obstructive sleep apnea (adult) (pediatric): Secondary | ICD-10-CM | POA: Diagnosis not present

## 2022-04-06 DIAGNOSIS — R1032 Left lower quadrant pain: Secondary | ICD-10-CM | POA: Diagnosis not present

## 2022-04-10 DIAGNOSIS — R1032 Left lower quadrant pain: Secondary | ICD-10-CM | POA: Diagnosis not present

## 2022-04-14 DIAGNOSIS — M5116 Intervertebral disc disorders with radiculopathy, lumbar region: Secondary | ICD-10-CM | POA: Diagnosis not present

## 2022-04-15 DIAGNOSIS — M7122 Synovial cyst of popliteal space [Baker], left knee: Secondary | ICD-10-CM | POA: Diagnosis not present

## 2022-04-15 DIAGNOSIS — M7989 Other specified soft tissue disorders: Secondary | ICD-10-CM | POA: Diagnosis not present

## 2022-04-20 DIAGNOSIS — M545 Low back pain, unspecified: Secondary | ICD-10-CM | POA: Diagnosis not present

## 2022-04-29 DIAGNOSIS — Z0000 Encounter for general adult medical examination without abnormal findings: Secondary | ICD-10-CM | POA: Diagnosis not present

## 2022-04-29 DIAGNOSIS — M545 Low back pain, unspecified: Secondary | ICD-10-CM | POA: Diagnosis not present

## 2022-04-29 DIAGNOSIS — N1832 Chronic kidney disease, stage 3b: Secondary | ICD-10-CM | POA: Diagnosis not present

## 2022-04-29 DIAGNOSIS — I129 Hypertensive chronic kidney disease with stage 1 through stage 4 chronic kidney disease, or unspecified chronic kidney disease: Secondary | ICD-10-CM | POA: Diagnosis not present

## 2022-04-29 DIAGNOSIS — E1122 Type 2 diabetes mellitus with diabetic chronic kidney disease: Secondary | ICD-10-CM | POA: Diagnosis not present

## 2022-04-29 DIAGNOSIS — M47816 Spondylosis without myelopathy or radiculopathy, lumbar region: Secondary | ICD-10-CM | POA: Diagnosis not present

## 2022-04-29 DIAGNOSIS — Z23 Encounter for immunization: Secondary | ICD-10-CM | POA: Diagnosis not present

## 2022-04-29 DIAGNOSIS — M48061 Spinal stenosis, lumbar region without neurogenic claudication: Secondary | ICD-10-CM | POA: Diagnosis not present

## 2022-04-29 DIAGNOSIS — M5126 Other intervertebral disc displacement, lumbar region: Secondary | ICD-10-CM | POA: Diagnosis not present

## 2022-04-30 DIAGNOSIS — I129 Hypertensive chronic kidney disease with stage 1 through stage 4 chronic kidney disease, or unspecified chronic kidney disease: Secondary | ICD-10-CM | POA: Diagnosis not present

## 2022-04-30 DIAGNOSIS — N1832 Chronic kidney disease, stage 3b: Secondary | ICD-10-CM | POA: Diagnosis not present

## 2022-04-30 DIAGNOSIS — E1122 Type 2 diabetes mellitus with diabetic chronic kidney disease: Secondary | ICD-10-CM | POA: Diagnosis not present

## 2022-04-30 DIAGNOSIS — N183 Chronic kidney disease, stage 3 unspecified: Secondary | ICD-10-CM | POA: Diagnosis not present

## 2022-05-05 DIAGNOSIS — M545 Low back pain, unspecified: Secondary | ICD-10-CM | POA: Diagnosis not present

## 2022-05-08 DIAGNOSIS — S29012A Strain of muscle and tendon of back wall of thorax, initial encounter: Secondary | ICD-10-CM | POA: Diagnosis not present

## 2022-05-08 DIAGNOSIS — M545 Low back pain, unspecified: Secondary | ICD-10-CM | POA: Diagnosis not present

## 2022-05-08 DIAGNOSIS — Z981 Arthrodesis status: Secondary | ICD-10-CM | POA: Diagnosis not present

## 2022-05-08 DIAGNOSIS — R55 Syncope and collapse: Secondary | ICD-10-CM | POA: Diagnosis not present

## 2022-05-08 DIAGNOSIS — Z79899 Other long term (current) drug therapy: Secondary | ICD-10-CM | POA: Diagnosis not present

## 2022-05-08 DIAGNOSIS — H8110 Benign paroxysmal vertigo, unspecified ear: Secondary | ICD-10-CM | POA: Diagnosis not present

## 2022-05-08 DIAGNOSIS — Z9181 History of falling: Secondary | ICD-10-CM | POA: Diagnosis not present

## 2022-05-08 DIAGNOSIS — R251 Tremor, unspecified: Secondary | ICD-10-CM | POA: Diagnosis not present

## 2022-05-08 DIAGNOSIS — G959 Disease of spinal cord, unspecified: Secondary | ICD-10-CM | POA: Diagnosis not present

## 2022-05-18 DIAGNOSIS — M5416 Radiculopathy, lumbar region: Secondary | ICD-10-CM | POA: Diagnosis not present

## 2022-05-18 DIAGNOSIS — M5000 Cervical disc disorder with myelopathy, unspecified cervical region: Secondary | ICD-10-CM | POA: Diagnosis not present

## 2022-05-20 DIAGNOSIS — M5000 Cervical disc disorder with myelopathy, unspecified cervical region: Secondary | ICD-10-CM | POA: Diagnosis not present

## 2022-05-20 DIAGNOSIS — M5416 Radiculopathy, lumbar region: Secondary | ICD-10-CM | POA: Diagnosis not present

## 2022-05-25 DIAGNOSIS — M5416 Radiculopathy, lumbar region: Secondary | ICD-10-CM | POA: Diagnosis not present

## 2022-05-25 DIAGNOSIS — M5000 Cervical disc disorder with myelopathy, unspecified cervical region: Secondary | ICD-10-CM | POA: Diagnosis not present

## 2022-05-29 DIAGNOSIS — M5116 Intervertebral disc disorders with radiculopathy, lumbar region: Secondary | ICD-10-CM | POA: Diagnosis not present

## 2022-09-09 IMAGING — CR DG CHEST 2V
2 series · 2 of 2 positions shown · non-contrast
Comparison: December 21, 2018

CLINICAL DATA: Chest pain.

EXAM:
CHEST - 2 VIEW

[w chest pa]
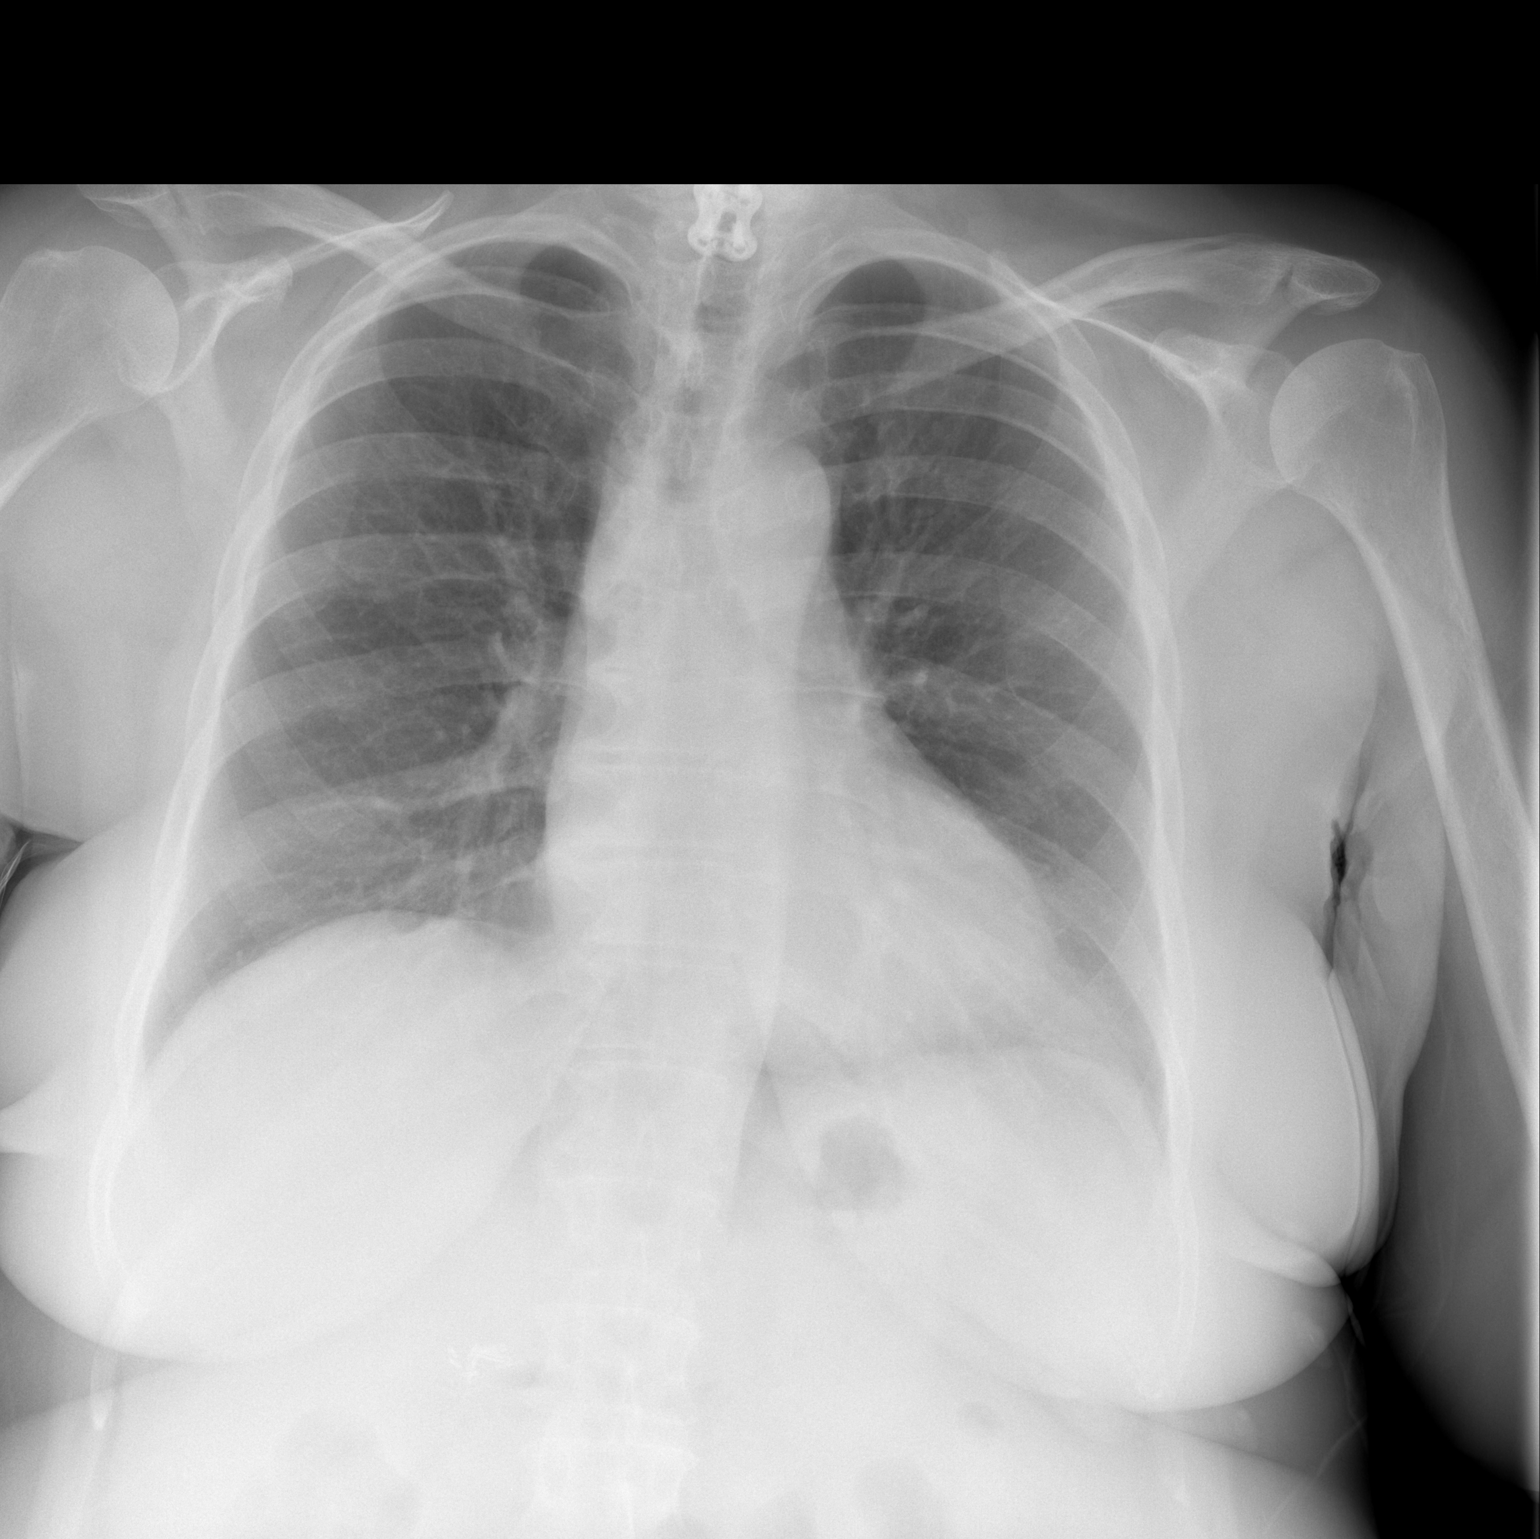

[w chest lat]
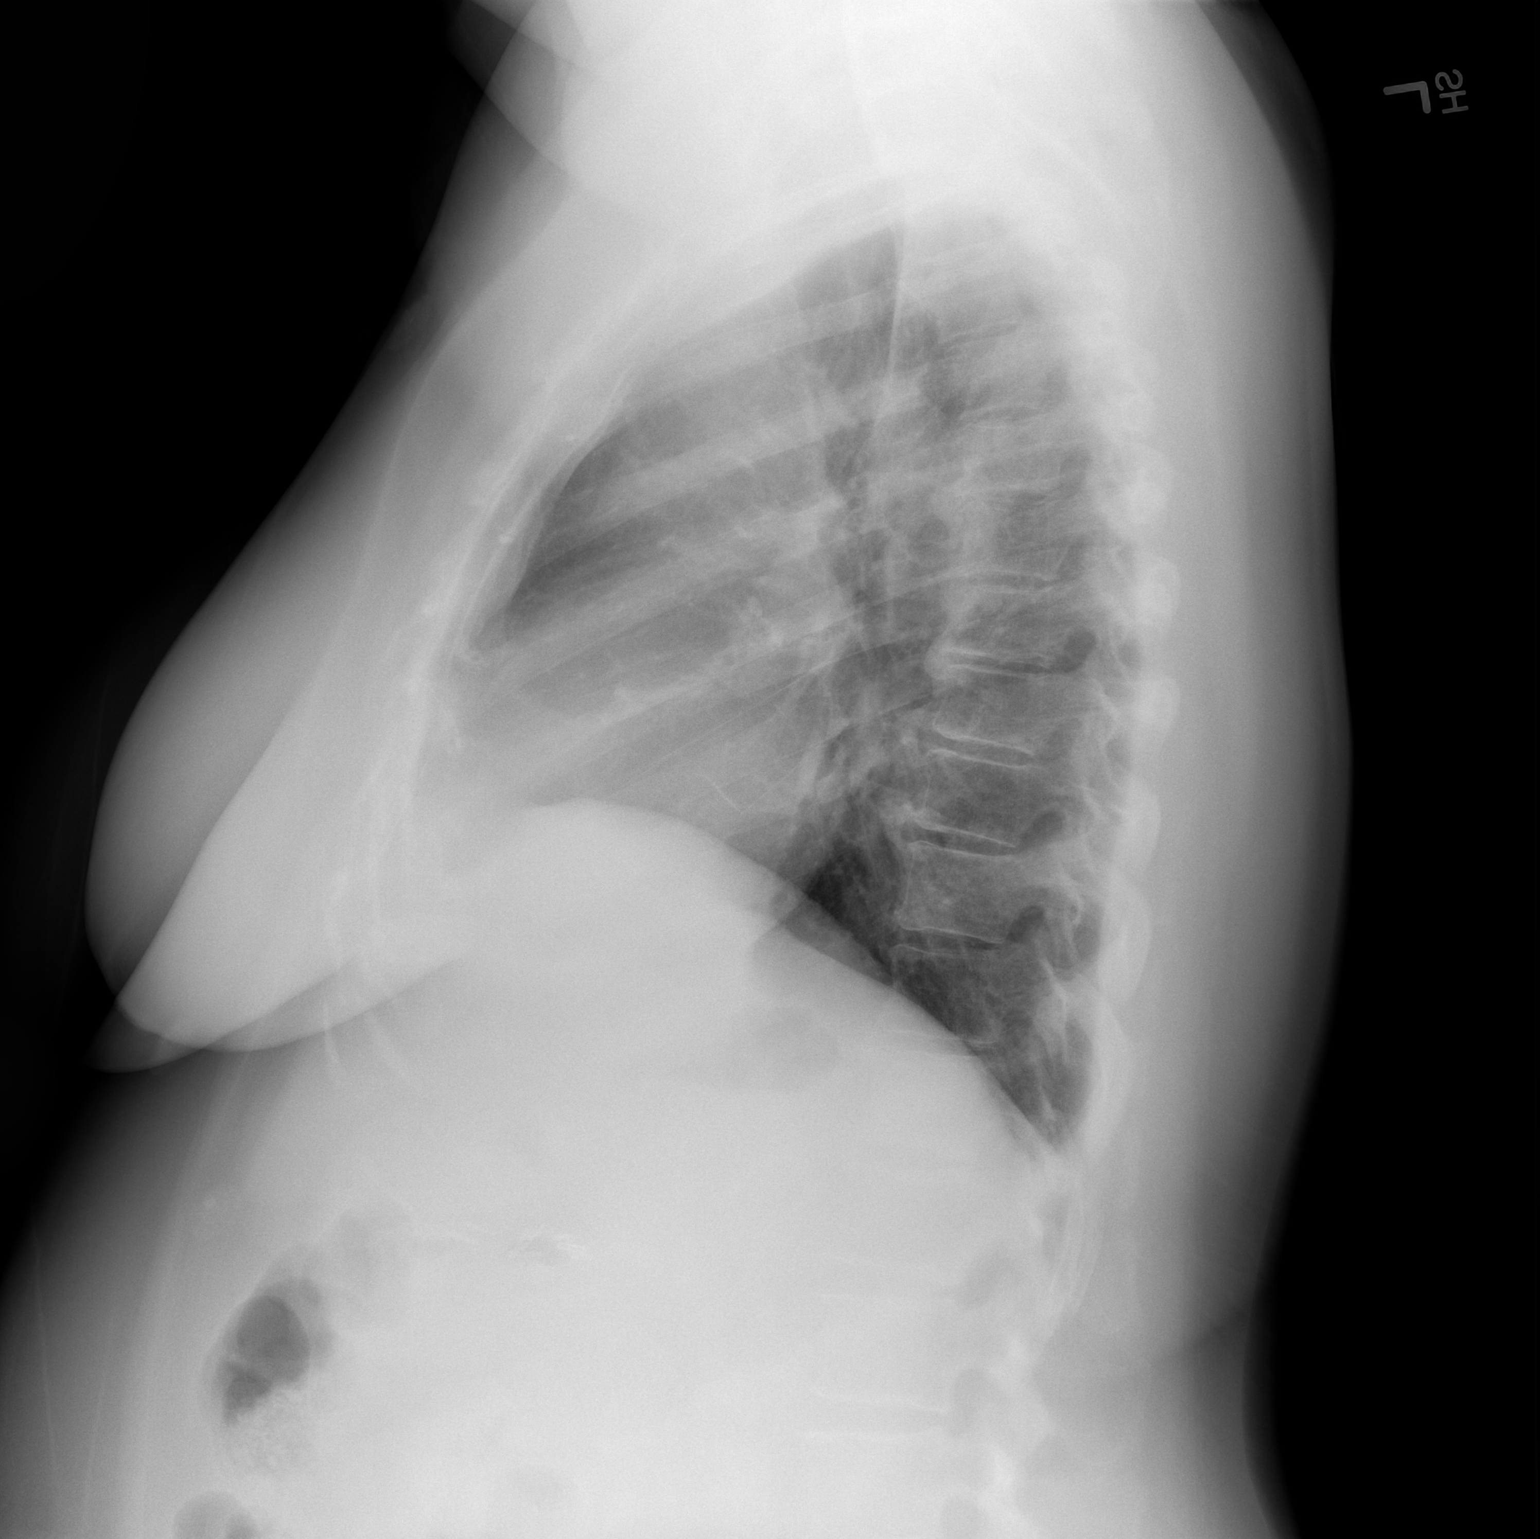

[2 of 2 positions shown; findings below may reference images not displayed]

FINDINGS: The heart size and mediastinal contours are within normal limits.
Both lungs are clear. The visualized skeletal structures are
unremarkable.
IMPRESSION: No active cardiopulmonary disease.

## 2022-09-21 ENCOUNTER — Other Ambulatory Visit: Payer: Self-pay | Admitting: Family Medicine

## 2022-09-21 DIAGNOSIS — Z1231 Encounter for screening mammogram for malignant neoplasm of breast: Secondary | ICD-10-CM

## 2022-11-13 ENCOUNTER — Ambulatory Visit
Admission: RE | Admit: 2022-11-13 | Discharge: 2022-11-13 | Disposition: A | Discharge status: Home / Self Care | Payer: Medicare HMO | Source: Ambulatory Visit | Attending: Family Medicine | Admitting: Family Medicine

## 2022-11-13 DIAGNOSIS — Z1231 Encounter for screening mammogram for malignant neoplasm of breast: Secondary | ICD-10-CM

## 2023-10-04 ENCOUNTER — Other Ambulatory Visit: Payer: Self-pay | Admitting: Family Medicine

## 2023-10-04 DIAGNOSIS — Z1231 Encounter for screening mammogram for malignant neoplasm of breast: Secondary | ICD-10-CM

## 2023-11-19 ENCOUNTER — Encounter (HOSPITAL_COMMUNITY): Payer: Self-pay

## 2023-11-26 ENCOUNTER — Ambulatory Visit
Admission: RE | Admit: 2023-11-26 | Discharge: 2023-11-26 | Disposition: A | Discharge status: Home / Self Care | Source: Ambulatory Visit | Attending: Family Medicine | Admitting: Family Medicine

## 2023-11-26 DIAGNOSIS — Z1231 Encounter for screening mammogram for malignant neoplasm of breast: Secondary | ICD-10-CM
